# Patient Record
Sex: Female | Born: 2000 | Race: Black or African American | Hispanic: No | Marital: Single | Smoking: Former smoker
Health system: Southern US, Community
[De-identification: ages and names within clinical notes are randomized; demographics above are authoritative.]

## PROBLEM LIST (undated history)

## (undated) DIAGNOSIS — J45909 Unspecified asthma, uncomplicated: Secondary | ICD-10-CM

## (undated) DIAGNOSIS — T7840XA Allergy, unspecified, initial encounter: Secondary | ICD-10-CM

## (undated) DIAGNOSIS — F419 Anxiety disorder, unspecified: Secondary | ICD-10-CM

## (undated) DIAGNOSIS — F32A Depression, unspecified: Secondary | ICD-10-CM

## (undated) HISTORY — DX: Allergy, unspecified, initial encounter: T78.40XA

## (undated) HISTORY — PX: NO PAST SURGERIES: SHX2092

## (undated) HISTORY — DX: Anxiety disorder, unspecified: F41.9

## (undated) HISTORY — DX: Depression, unspecified: F32.A

---
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
## 2021-02-04 ENCOUNTER — Emergency Department (HOSPITAL_COMMUNITY)
Admission: EM | Admit: 2021-02-04 | Discharge: 2021-02-04 | Disposition: A | Discharge status: Home / Self Care | Payer: BLUE CROSS/BLUE SHIELD | Attending: Emergency Medicine | Admitting: Emergency Medicine

## 2021-02-04 DIAGNOSIS — J4520 Mild intermittent asthma, uncomplicated: Secondary | ICD-10-CM | POA: Insufficient documentation

## 2021-02-04 DIAGNOSIS — J4541 Moderate persistent asthma with (acute) exacerbation: Secondary | ICD-10-CM

## 2021-02-04 DIAGNOSIS — Z20822 Contact with and (suspected) exposure to covid-19: Secondary | ICD-10-CM | POA: Diagnosis not present

## 2021-02-04 DIAGNOSIS — R0602 Shortness of breath: Secondary | ICD-10-CM | POA: Diagnosis present

## 2021-02-04 LAB — RESP PANEL BY RT-PCR (FLU A&B, COVID) ARPGX2
Influenza A by PCR: NEGATIVE
Influenza B by PCR: NEGATIVE
SARS Coronavirus 2 by RT PCR: NEGATIVE

## 2021-02-04 MED ORDER — IPRATROPIUM BROMIDE 0.02 % IN SOLN
0.5000 mg | Freq: Once | RESPIRATORY_TRACT | Status: AC
  Administered 2021-02-04: 08:00:00 0.5 mg via RESPIRATORY_TRACT
  Filled 2021-02-04: qty 2.5

## 2021-02-04 MED ORDER — ALBUTEROL SULFATE HFA 108 (90 BASE) MCG/ACT IN AERS
2.0000 | INHALATION_SPRAY | Freq: Once | RESPIRATORY_TRACT | Status: AC
  Administered 2021-02-04: 09:00:00 2 via RESPIRATORY_TRACT
  Filled 2021-02-04: qty 6.7

## 2021-02-04 MED ORDER — ALBUTEROL SULFATE (2.5 MG/3ML) 0.083% IN NEBU
5.0000 mg | INHALATION_SOLUTION | Freq: Once | RESPIRATORY_TRACT | Status: AC
  Administered 2021-02-04: 08:00:00 5 mg via RESPIRATORY_TRACT
  Filled 2021-02-04: qty 6

## 2021-02-04 MED ORDER — PREDNISONE 20 MG PO TABS: 40.0000 mg | ORAL_TABLET | Freq: Every day | ORAL | 0 refills | Status: DC

## 2021-02-04 NOTE — ED Notes (Signed)
Pt provided with meal tray.

## 2021-02-04 NOTE — ED Notes (Signed)
Pt ambulated to the bathroom w/ steady gait w/ out need for assistance.

## 2021-02-04 NOTE — ED Notes (Signed)
Pt states understanding of dc instructions, importance of follow up, and prescription. Pt denies questions or concerns upon dc. Pt declined wheelchair assistance upon dc. Pt ambulated out of ed w/ steady gait. No belongings left in room upon dc.  

## 2021-02-04 NOTE — ED Provider Notes (Signed)
[facility]-EMERGENCY DEPT Provider Note   CSN: [id number] Arrival date & time: 02/04/21  0175     History Chief Complaint  Patient presents with   Shortness of Breath    Abigail Sandoval is a 20 y.o. female.  Patient is a 20 year old female with a history of asthma who is presenting today with complaint of wheezing, chest tightness and shortness of breath that started yesterday.  She reports that she was out of her inhaler but had 1 dose of prednisone left so took that yesterday but continues to have the above symptoms because she has not had her inhaler.  She has had a cough that is been nonproductive and denies any congestion, fever or myalgias.  She has no abdominal pain and last menses was last week.  The history is provided by the patient.  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     Family History  Problem Relation Age of Onset   Asthma Mother    Asthma Sister    Asthma Maternal Grandmother    Allergic rhinitis Neg Hx    Angioedema Neg Hx    Atopy Neg Hx    Eczema Neg Hx    Immunodeficiency Neg Hx    Urticaria Neg Hx     Social History   Tobacco Use   Smoking status: Never   Smokeless tobacco: Never  Vaping Use   Vaping Use: Former  Substance Use Topics   Alcohol use: No   Drug use: Not Currently    Types: Marijuana    Home Medications Prior to Admission medications   Medication Sig Start Date End Date Taking? Authorizing Provider  albuterol (VENTOLIN HFA) 108 (90 Base) MCG/ACT inhaler TAKE 2 PUFFS BY MOUTH EVERY 6 HOURS AS NEEDED FOR WHEEZE OR SHORTNESS OF BREATH 08/08/20    Margaree Mackintosh, MD  azithromycin (ZITHROMAX) 250 MG tablet Take 1 tablet (250 mg total) by mouth daily. Take first 2 tablets together, then 1 every day until finished. 04/07/20   Mickie Bail, NP  ibuprofen (ADVIL) 600 MG tablet Take 1 tablet (600 mg total) by mouth every 6 (six) hours as needed for moderate pain. 12/22/20   Fayrene Helper, PA-C  JUNEL 1.5/30 1.5-30 MG-MCG tablet TAKE 1 TABLET BY MOUTH DAILY. Patient not taking: No sig reported 03/23/18   Margaree Mackintosh, MD  ondansetron (ZOFRAN) 4 MG tablet Take 1 tablet (4 mg total) by mouth every 6 (six) hours. 11/26/20   Virgina Norfolk, DO    Allergies    Patient has no known allergies.  Review of Systems   Review of Systems  Respiratory:  Positive for shortness of breath.   All other systems reviewed and are negative.  Physical Exam Updated Vital Signs BP 103/79    Pulse 88    Temp (!) 97.4 F (36.3 C) (Oral)    Resp 16    Ht 5\' 10"  (1.778 m)    Wt 108.9 kg    SpO2 97%    BMI 34.44 kg/m   Physical Exam Constitutional:  General: She is not in acute distress.    Appearance: She is well-developed.  HENT:     Head: Normocephalic and atraumatic.     Right Ear: External ear normal.     Left Ear: External ear normal.  Eyes:     General:        Right eye: No discharge.     Conjunctiva/sclera: Conjunctivae normal.     Pupils: Pupils are equal, round, and reactive to light.  Cardiovascular:     Rate and Rhythm: Normal rate and regular rhythm.     Heart sounds: Normal heart sounds. No murmur heard. Pulmonary:     Effort: Pulmonary effort is normal. No tachypnea or respiratory distress.     Breath sounds: Wheezing present. No decreased breath sounds, rhonchi or rales.  Abdominal:     Palpations: Abdomen is soft.     Tenderness: There is no abdominal tenderness.  Musculoskeletal:        General: No tenderness. Normal range of motion.     Cervical back: Normal range of motion and neck supple.     Right lower leg: No edema.      Left lower leg: No edema.  Skin:    General: Skin is warm and dry.     Findings: No rash.  Neurological:     Mental Status: She is alert and oriented to person, place, and time. Mental status is at baseline.  Psychiatric:        Mood and Affect: Mood normal.    ED Results / Procedures / Treatments   Labs (all labs ordered are listed, but only abnormal results are displayed) Labs Reviewed  RESP PANEL BY RT-PCR (FLU A&B, COVID) [id number]    EKG None  Radiology No results found.  Procedures Procedures   Medications Ordered in ED Medications  albuterol (PROVENTIL) (2.5 MG/3ML) 0.083% nebulizer solution 5 mg (5 mg Nebulization Given 02/04/21 0814)  ipratropium (ATROVENT) nebulizer solution 0.5 mg (0.5 mg Nebulization Given 02/04/21 3614)    ED Course  I have reviewed the triage vital signs and the nursing notes.  Pertinent labs & imaging results that were available during my care of the patient were reviewed by me and considered in my medical decision making (see chart for details).    MDM Rules/Calculators/A&P                         Pt with typical asthma exacerbation  symptoms.  No infectious sx, productive cough or other complaints.  COVID and flu neg.  Wheezing on exam.  will give albuterol/atrovent and recheck.  8:53 AM Pt improved after treatment.  Given new inhaler here and short course of prednisone.     Final Clinical Impression(s) / ED Diagnoses Final diagnoses:  None    Rx / DC Orders ED Discharge Orders     None        Gwyneth Sprout, MD 02/04/21 [phone]

## 2022-03-03 IMAGING — DX DG CHEST 2V
2 series · 2 of 2 positions shown · non-contrast
Comparison: None.

CLINICAL DATA: Cough and shortness of breath for 3 days.  Asthma.

EXAM:
CHEST - 2 VIEW

[chest pa]
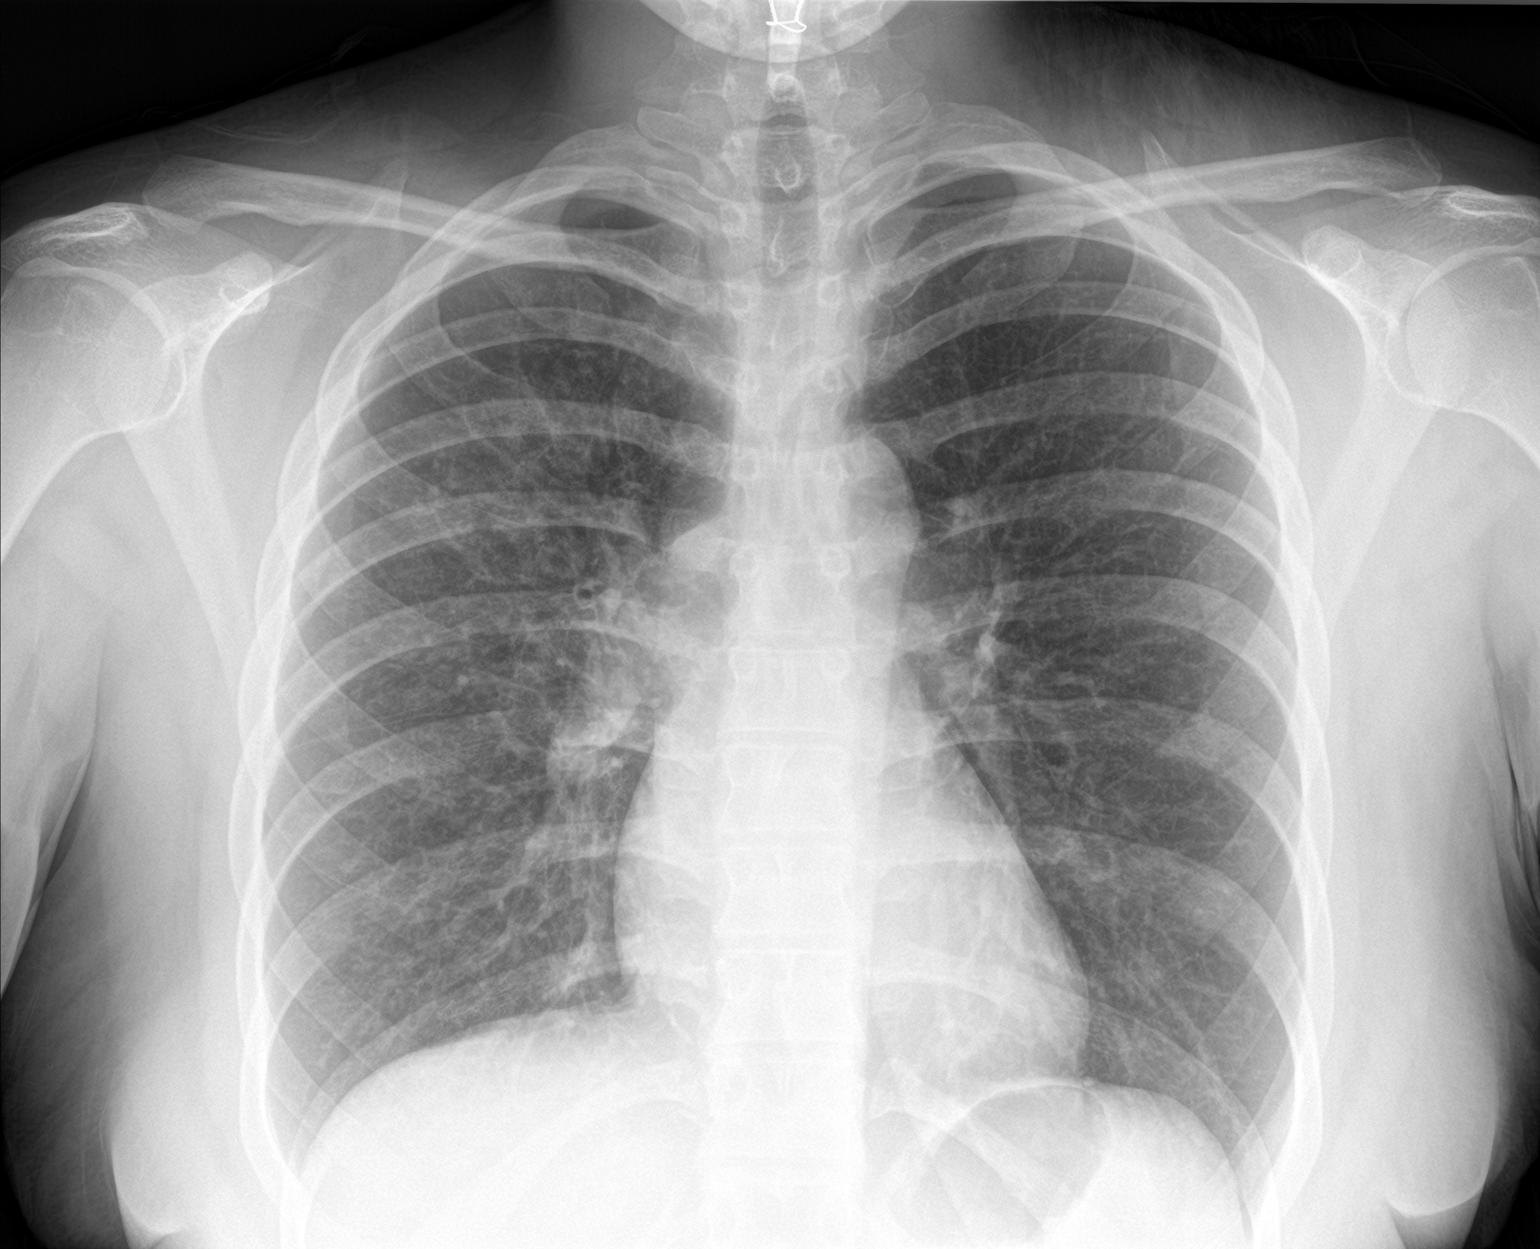

[chest lat]
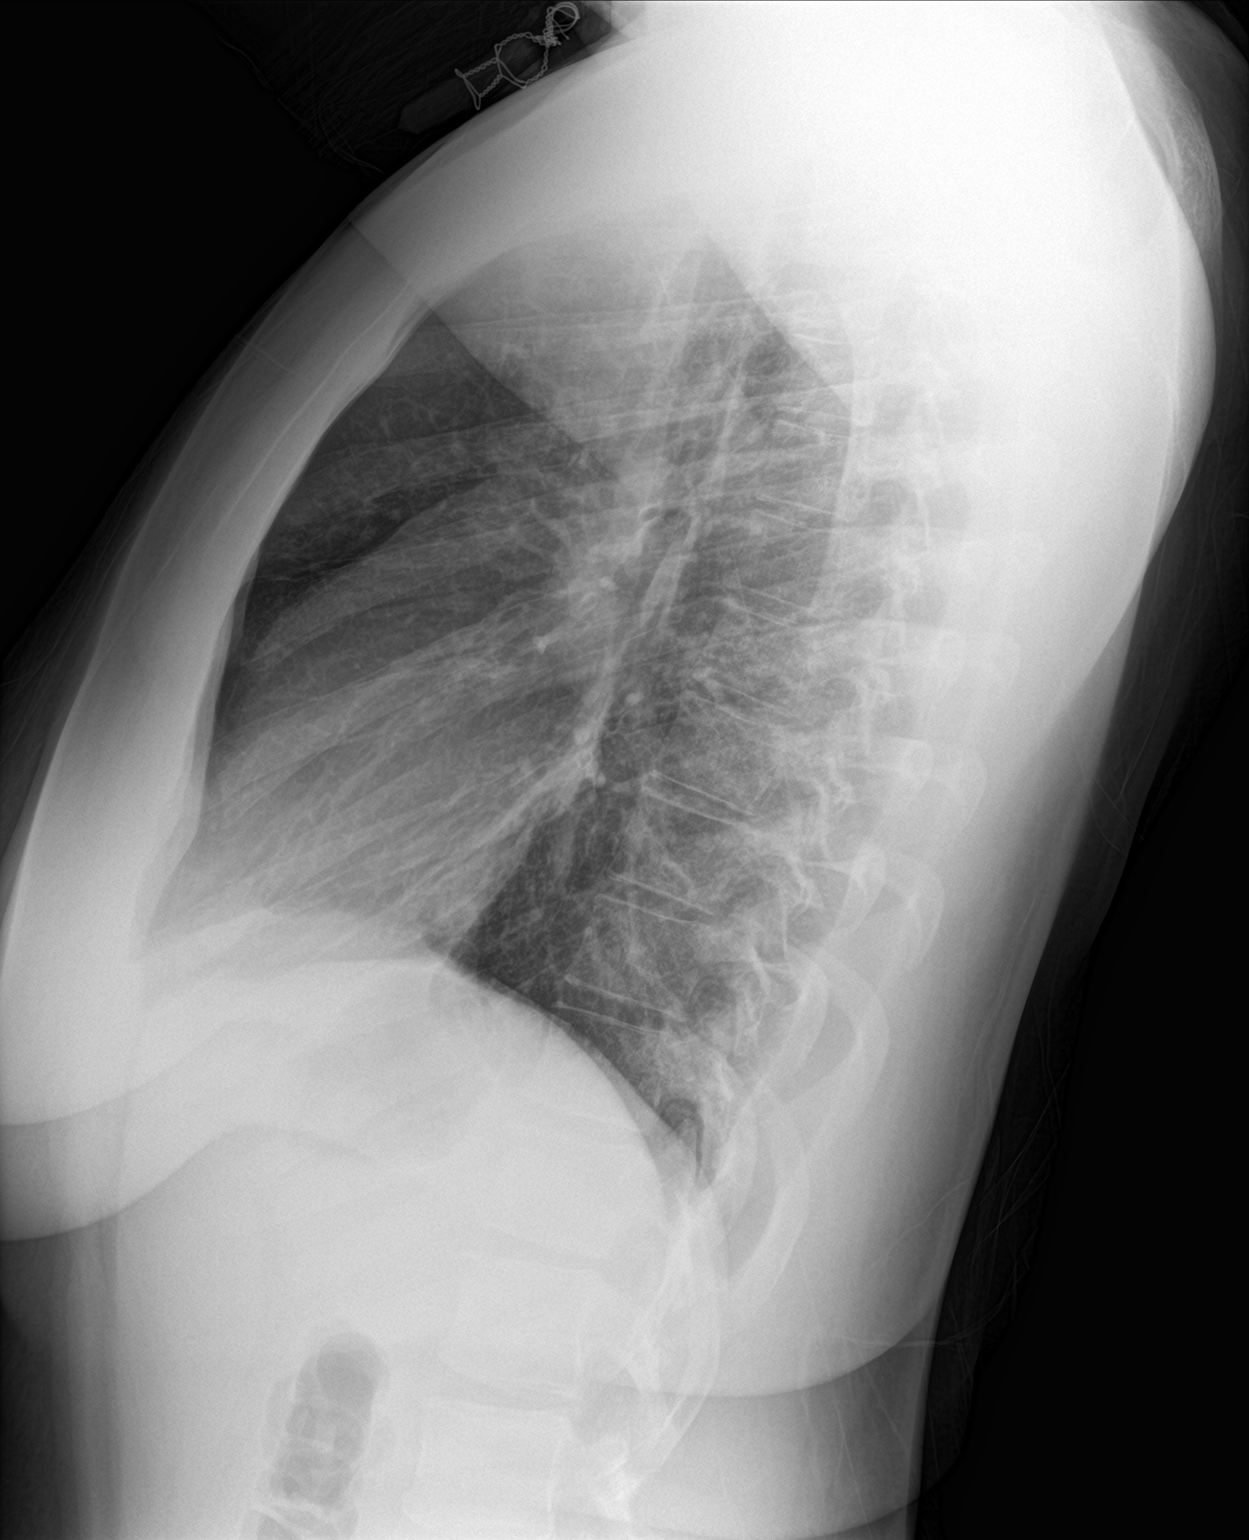

[2 of 2 positions shown; findings below may reference images not displayed]

FINDINGS: The heart size and mediastinal contours are within normal limits.
Subtle asymmetric streaky opacity is seen in the central right upper
lobe, suspicious for bronchopneumonia. No other areas of pulmonary
opacity are seen. No evidence of pleural effusion.
IMPRESSION: Subtle asymmetric streaky opacity in right upper lobe, suspicious
for bronchopneumonia.

## 2022-04-29 IMAGING — CR DG CHEST 2V
2 series · 2 of 2 positions shown · non-contrast
Comparison: Chest radiographs 04/07/2020.

CLINICAL DATA: 19-year-old female with cough and shortness of
breath increasing over the past 3-4 days. History of asthma.

EXAM:
CHEST - 2 VIEW

[w chest pa]
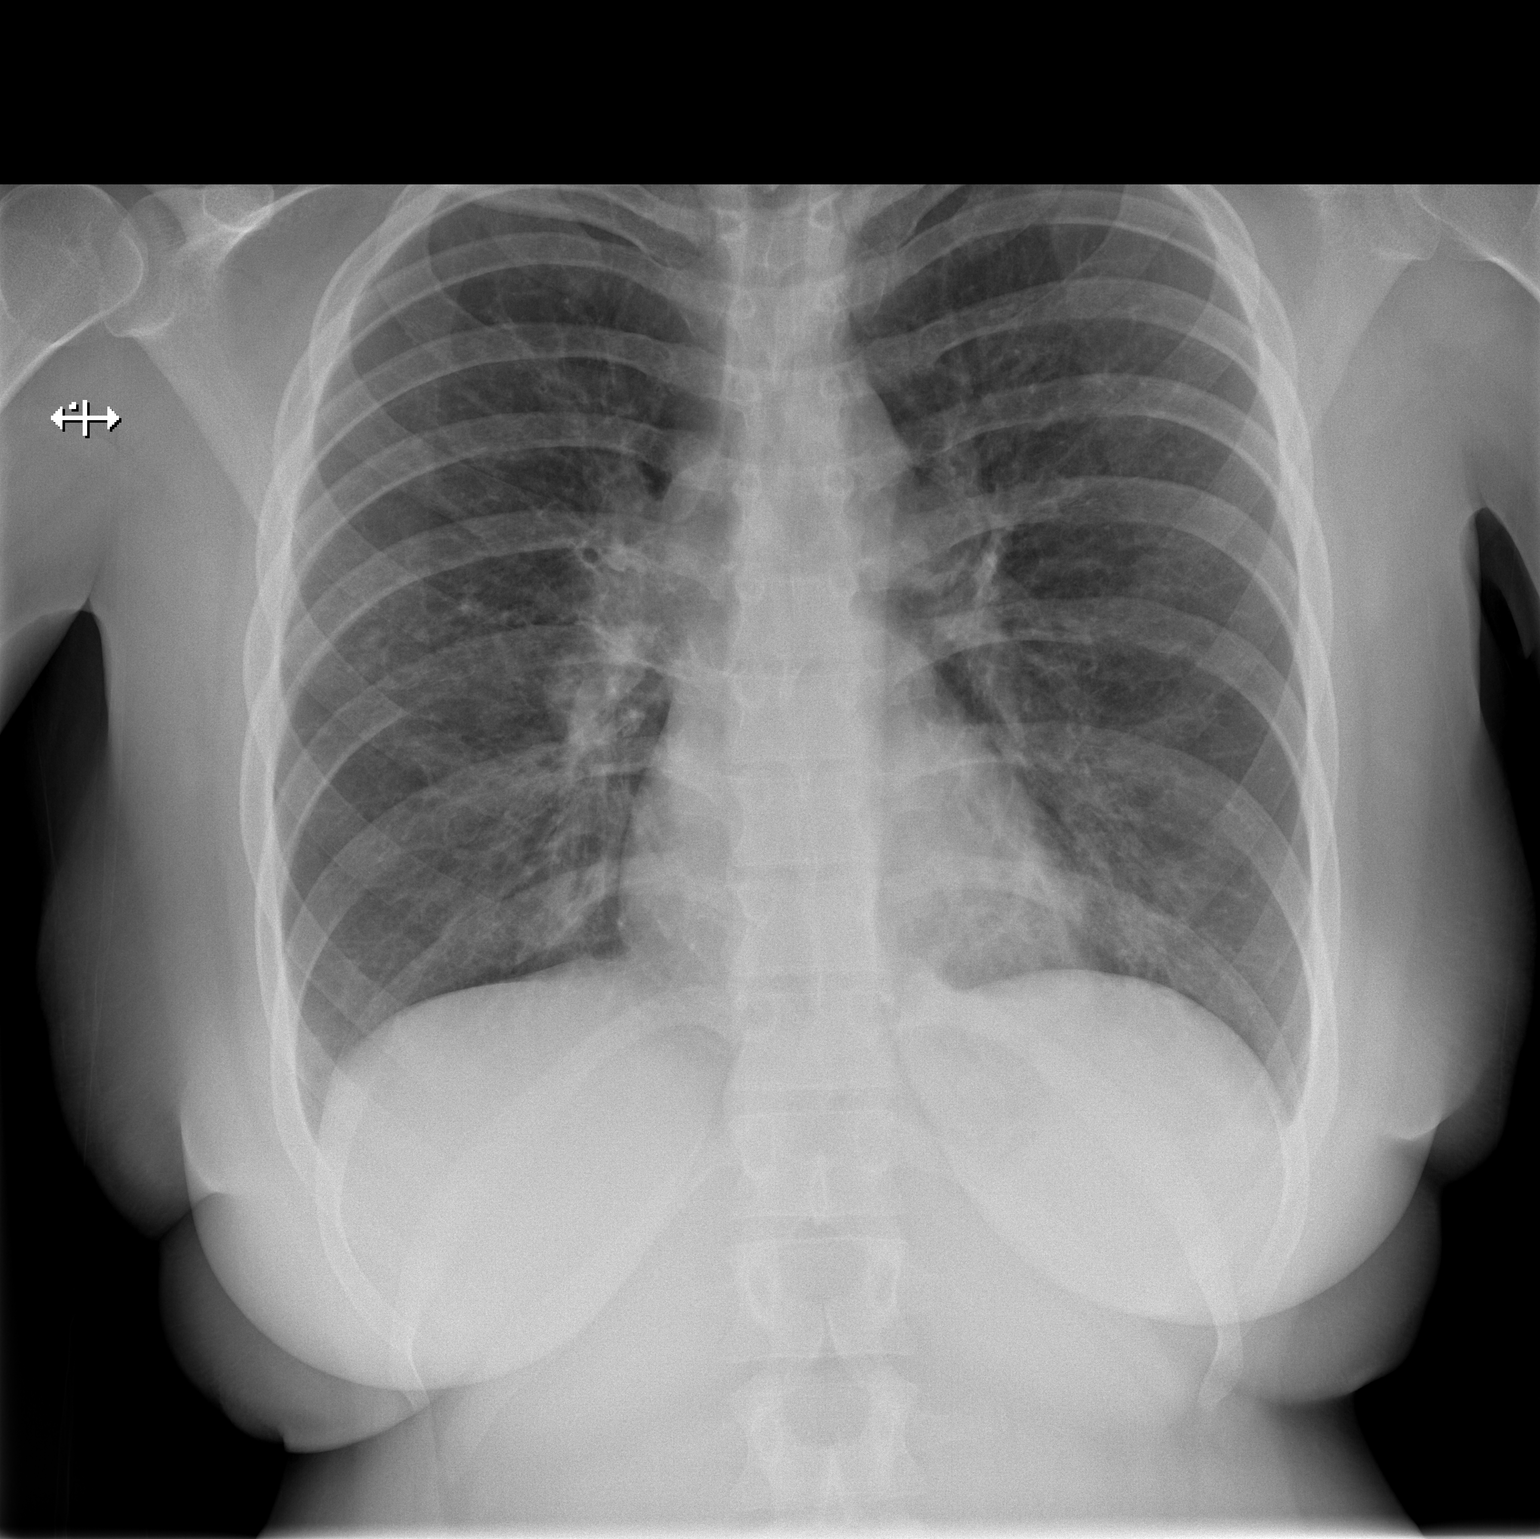

[w chest lat]
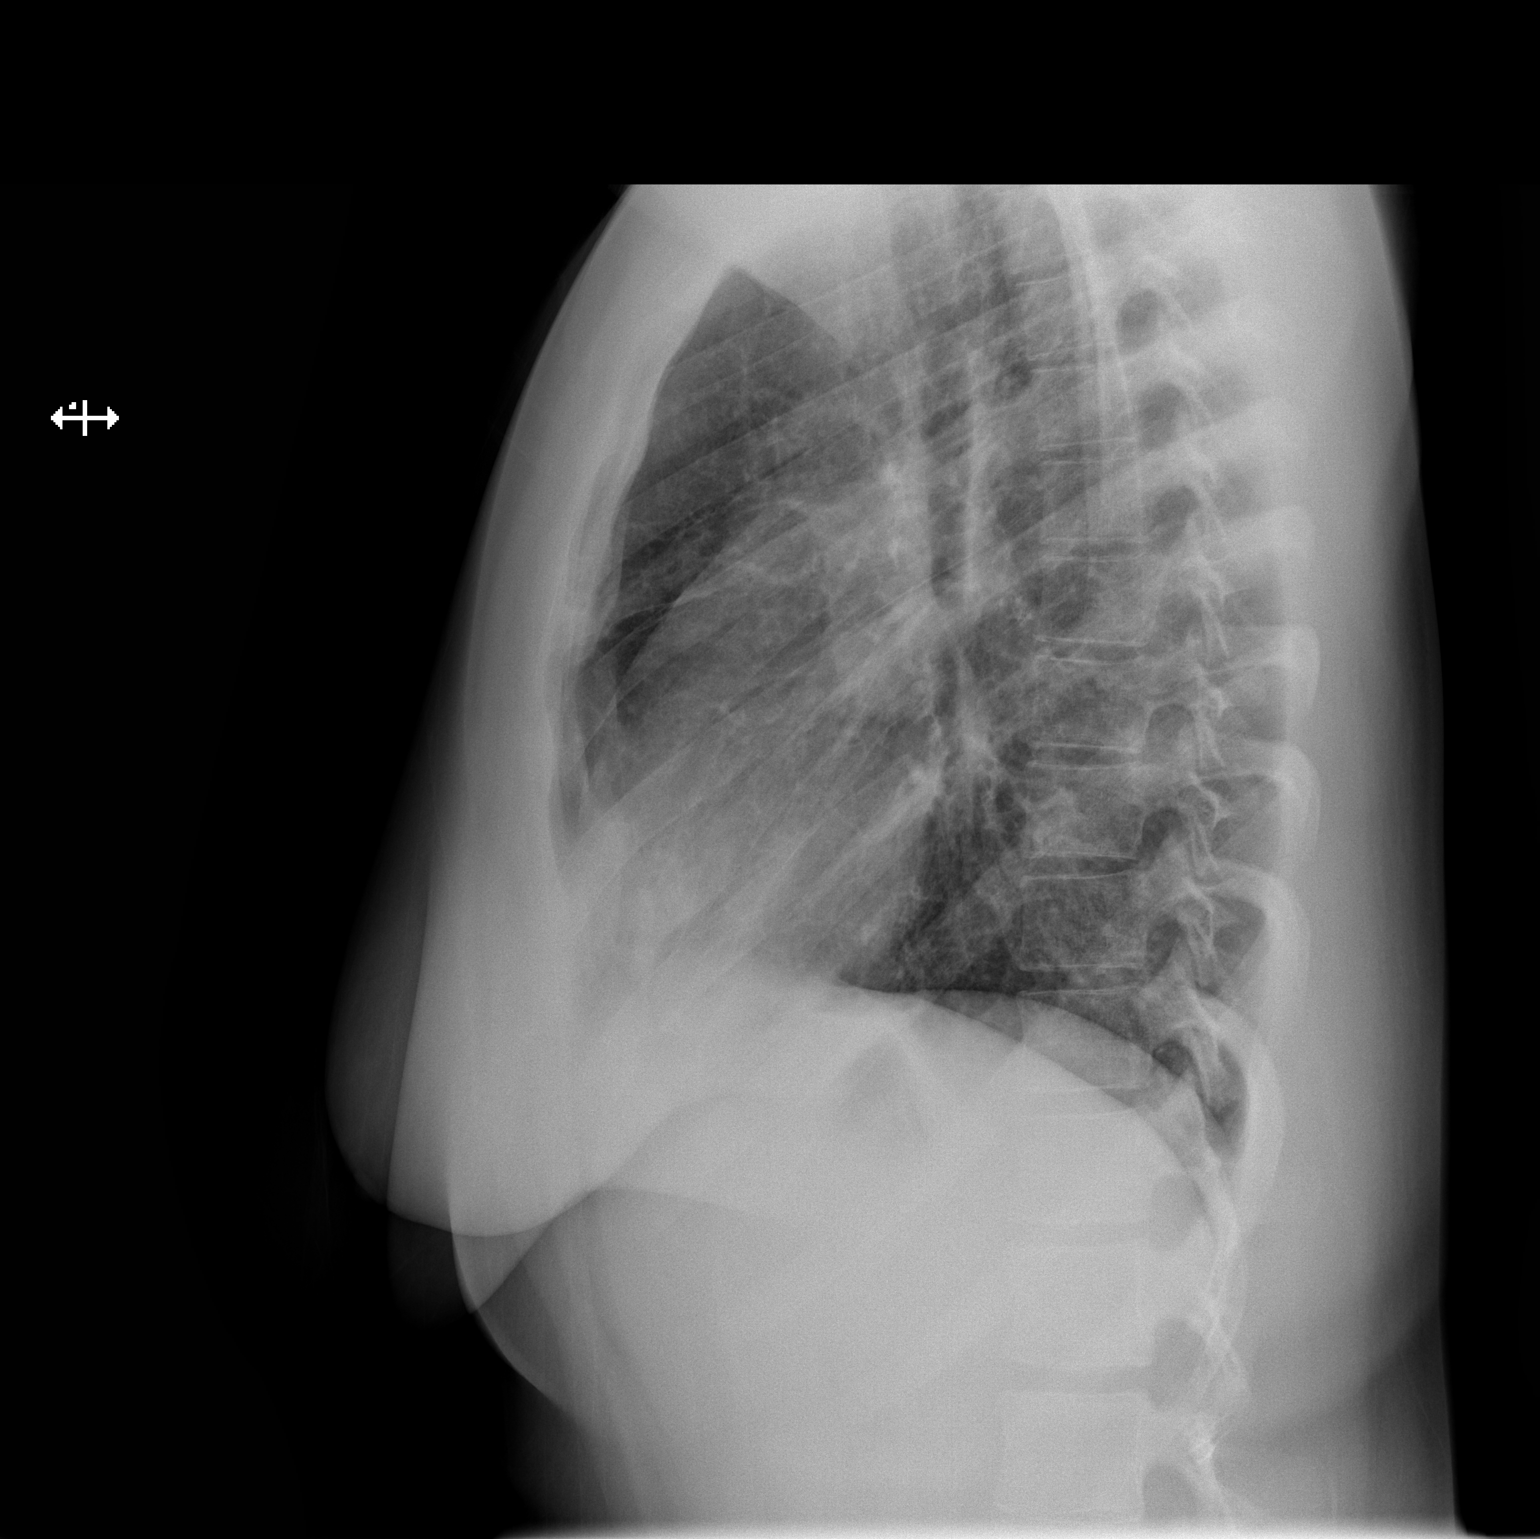

[2 of 2 positions shown; findings below may reference images not displayed]

FINDINGS: Lung volumes and mediastinal contours are within normal limits.
Visualized tracheal air column is within normal limits. Increased
pulmonary interstitial markings in both lungs. Patchy new middle
lobe opacity on the lateral view, probably in the lingula. No
consolidation or pleural effusion. No pneumothorax.

Paucity of bowel gas in the upper abdomen. No osseous abnormality
identified.
IMPRESSION: Patchy middle lobe opacity, suspected to be in the lingula, with
underlying chronic pulmonary interstitial markings. Favor acute
bronchopneumonia superimposed on chronic reactive airway disease. No
pleural effusion.

## 2022-05-18 ENCOUNTER — Other Ambulatory Visit: Payer: Self-pay | Admitting: Internal Medicine

## 2022-05-18 ENCOUNTER — Encounter: Payer: Self-pay | Admitting: Physician Assistant

## 2022-05-18 VITALS — BP 110/70 | HR 87 | Temp 98.2°F | Ht 70.25 in | Wt 283.4 lb

## 2022-05-18 DIAGNOSIS — R569 Unspecified convulsions: Secondary | ICD-10-CM

## 2022-05-18 DIAGNOSIS — E669 Obesity, unspecified: Secondary | ICD-10-CM

## 2022-05-18 DIAGNOSIS — Z0001 Encounter for general adult medical examination with abnormal findings: Secondary | ICD-10-CM | POA: Diagnosis not present

## 2022-05-18 DIAGNOSIS — F419 Anxiety disorder, unspecified: Secondary | ICD-10-CM

## 2022-05-18 DIAGNOSIS — R55 Syncope and collapse: Secondary | ICD-10-CM

## 2022-05-18 DIAGNOSIS — Z789 Other specified health status: Secondary | ICD-10-CM | POA: Diagnosis not present

## 2022-05-18 DIAGNOSIS — N926 Irregular menstruation, unspecified: Secondary | ICD-10-CM

## 2022-05-18 DIAGNOSIS — F319 Bipolar disorder, unspecified: Secondary | ICD-10-CM | POA: Diagnosis not present

## 2022-05-18 DIAGNOSIS — J4540 Moderate persistent asthma, uncomplicated: Secondary | ICD-10-CM

## 2022-05-18 LAB — LIPID PANEL
Cholesterol: 127 mg/dL (ref 0–200)
HDL: 47.6 mg/dL (ref >39.00)
LDL Cholesterol: 64 mg/dL (ref 0–99)
NonHDL: 79.34
Total CHOL/HDL Ratio: 3
Triglycerides: 75 mg/dL (ref 0.0–149.0)
VLDL: 15 mg/dL (ref 0.0–40.0)

## 2022-05-18 LAB — CBC WITH DIFFERENTIAL/PLATELET
Basophils Absolute: 0 10*3/uL (ref 0.0–0.1)
Basophils Relative: 0.7 % (ref 0.0–3.0)
Eosinophils Absolute: 0.2 10*3/uL (ref 0.0–0.7)
Eosinophils Relative: 4 % (ref 0.0–5.0)
HCT: 38.6 % (ref 36.0–46.0)
Hemoglobin: 12.8 g/dL (ref 12.0–15.0)
Lymphocytes Relative: 25.8 % (ref 12.0–46.0)
Lymphs Abs: 1.3 10*3/uL (ref 0.7–4.0)
MCHC: 33.1 g/dL (ref 30.0–36.0)
MCV: 84.8 fl (ref 78.0–100.0)
Monocytes Absolute: 0.5 10*3/uL (ref 0.1–1.0)
Monocytes Relative: 11.3 % (ref 3.0–12.0)
Neutro Abs: 2.8 10*3/uL (ref 1.4–7.7)
Neutrophils Relative %: 58.2 % (ref 43.0–77.0)
Platelets: 319 10*3/uL (ref 150.0–400.0)
RBC: 4.55 Mil/uL (ref 3.87–5.11)
RDW: 14.6 % (ref 11.5–14.6)
WBC: 4.9 10*3/uL (ref 4.5–10.5)

## 2022-05-18 LAB — COMPREHENSIVE METABOLIC PANEL
ALT: 18 U/L (ref 0–35)
AST: 14 U/L (ref 0–37)
Albumin: 4.1 g/dL (ref 3.5–5.2)
Alkaline Phosphatase: 55 U/L (ref 39–117)
BUN: 15 mg/dL (ref 6–23)
CO2: 24 mEq/L (ref 19–32)
Calcium: 9.3 mg/dL (ref 8.4–10.5)
Chloride: 107 mEq/L (ref 96–112)
Creatinine, Ser: 1 mg/dL (ref 0.40–1.20)
GFR: 81.09 mL/min (ref >60.00)
Glucose, Bld: 68 mg/dL — ABNORMAL LOW (ref 70–99)
Potassium: 3.9 mEq/L (ref 3.5–5.1)
Sodium: 138 mEq/L (ref 135–145)
Total Bilirubin: 0.3 mg/dL (ref 0.2–1.2)
Total Protein: 6.8 g/dL (ref 6.0–8.3)

## 2022-05-18 LAB — HEMOGLOBIN A1C: Hgb A1c MFr Bld: 5.3 % (ref 4.6–6.5)

## 2022-05-18 LAB — TSH: TSH: 1.1 u[IU]/mL (ref 0.35–5.50)

## 2022-05-18 LAB — POCT URINE PREGNANCY: Preg Test, Ur: NEGATIVE

## 2022-05-18 MED ORDER — BECLOMETHASONE DIPROP HFA 40 MCG/ACT IN AERB: 1.0000 | INHALATION_SPRAY | Freq: Two times a day (BID) | RESPIRATORY_TRACT | 1 refills | Status: DC

## 2022-05-18 NOTE — Patient Instructions (Addendum)
It was great to see you!  Referral to gynecology, cardiology, neurology, and talk therapy Start steroid inhaler twice daily to reduce frequency of albuterol use  Please contact the prescriber of your wellbutrin to let them know that you are being evaluated for possible seizure  If any new or worsening symptoms in the meantime, please go to the ER.  Please go to the lab for blood work.   Our office will call you with your results unless you have chosen to receive results via MyChart.  If your blood work is normal we will follow-up each year for physicals and as scheduled for chronic medical problems.  If anything is abnormal we will treat accordingly and get you in for a follow-up.  Take care,  Lelon Mast

## 2022-05-18 NOTE — Progress Notes (Signed)
Subjective:    Abigail Sandoval is a 22 y.o. female and is here for a comprehensive physical exam.  HPI  Health Maintenance Due  Topic Date Due   CHLAMYDIA SCREENING  02/19/2019    Acute Concerns: Syncope Occurred 3 weeks ago Had been awake for 24 hours prior to episode and the amount of sleep prior to that was 4-5 hours Was experiencing feelings of dissociation prior to and after episode Her boyfriend witnessed event said she passed out for a few seconds She had "light shaking throughout her entire body" while down No tongue biting or incontinence Did not hit her head Had limited food day of as well - denies prior low blood sugar episodes Denies prior episodes  Irregular periods Has had changes to her periods and would like to see gynecology Was on a OCP at one point   Chronic Issues: Asthma Triggered by season changes, pollen Uses albuterol twice daily Hx of ICS use but ran out  Bipolar depression; GAD Managed by provider at [facility] Currently taking Wellbutrin 150 mg daily (but is prescribed twice daily) and 12.5 mg zoloft BID Started Wellbutrin in Sep last year Denies current SI/HI  Health Maintenance: Immunizations -- UTD Colonoscopy -- n/a Mammogram -- n/a PAP -- n/a Bone Density -- n/a Diet -- eating healthy diet and drinks water Exercise -- getting regular exercise; walking every day  Sleep habits -- often doesn't have enough time to sleep Mood -- overall stable  UTD with dentist? - yes UTD with eye doctor? - yes  Weight history: Wt Readings from Last 10 Encounters:  05/18/22 283 lb 6.1 oz (128.5 kg)  02/04/21 240 lb (108.9 kg) (>99 %, Z= 2.40)*  12/26/20 230 lb (104.3 kg) (99 %, Z= 2.31)*  12/22/20 222 lb (100.7 kg) (99 %, Z= 2.23)*  11/26/20 215 lb (97.5 kg) (98 %, Z= 2.15)*  06/03/20 197 lb (89.4 kg) (97 %, Z= 1.92)*  07/27/19 239 lb (108.4 kg) (>99 %, Z= 2.37)*  03/14/18 247 lb (112 kg) (>99 %, Z= 2.49)*  02/28/18 242 lb  (109.8 kg) (>99 %, Z= 2.46)*  02/18/18 238 lb (108 kg) (>99 %, Z= 2.43)*   * Growth percentiles are based on CDC (Girls, 2-20 Years) data.   Body mass index is 40.37 kg/m. Patient's last menstrual period was 04/20/2022 (exact date).  Alcohol use:  reports current alcohol use.  Tobacco use:  Tobacco Use: High Risk (05/18/2022)   Patient History    Smoking Tobacco Use: Every Day    Smokeless Tobacco Use: Never    Passive Exposure: Not on file   Eligible for lung cancer screening? no     05/18/2022   10:40 AM  Depression screen PHQ 2/9  Decreased Interest 1  Down, Depressed, Hopeless 0  PHQ - 2 Score 1  Altered sleeping 1  Tired, decreased energy 1  Change in appetite 0  Feeling bad or failure about yourself  1  Trouble concentrating 2  Moving slowly or fidgety/restless 0  Suicidal thoughts 0  PHQ-9 Score 6  Difficult doing work/chores Somewhat difficult     Other providers/specialists: Patient Care Team: Jarold Motto, [place] as PCP - General (Physician Assistant)    PMHx, SurgHx, SocialHx, Medications, and Allergies were reviewed in the Visit Navigator and updated as appropriate.   Past Medical History:  Diagnosis Date   Allergy    Anxiety    Asthma    Depression      Past Surgical History:  Procedure Laterality Date   NO PAST SURGERIES       Family History  Problem Relation Age of Onset   Asthma Mother    Obesity Mother    Asthma Sister    Asthma Maternal Grandmother    Diabetes Maternal Grandfather    Allergic rhinitis Neg Hx    Angioedema Neg Hx    Atopy Neg Hx    Eczema Neg Hx    Immunodeficiency Neg Hx    Urticaria Neg Hx     Social History   Tobacco Use   Smoking status: Every Day    Types: E-cigarettes   Smokeless tobacco: Never  Vaping Use   Vaping Use: Every day  Substance Use Topics   Alcohol use: Yes    Comment: socially twice a month   Drug use: Yes    Types: Marijuana    Review of Systems:   Review of Systems   Constitutional:  Negative for chills, fever, malaise/fatigue and weight loss.  HENT:  Negative for hearing loss, sinus pain and sore throat.   Respiratory:  Negative for cough and hemoptysis.   Cardiovascular:  Negative for chest pain, palpitations, leg swelling and PND.  Gastrointestinal:  Negative for abdominal pain, constipation, diarrhea, heartburn, nausea and vomiting.  Genitourinary:  Negative for dysuria, frequency and urgency.  Musculoskeletal:  Negative for back pain, myalgias and neck pain.  Skin:  Negative for itching and rash.  Neurological:  Negative for dizziness, tingling, seizures and headaches.  Endo/Heme/Allergies:  Negative for polydipsia.  Psychiatric/Behavioral:  Negative for depression. The patient is not nervous/anxious.     Objective:   BP 110/70 (BP Location: Left Arm, Patient Position: Sitting, Cuff Size: Large)   Pulse 87   Temp 98.2 F (36.8 C) (Temporal)   Ht 5' 10.25" (1.784 m)   Wt 283 lb 6.1 oz (128.5 kg)   LMP 04/20/2022 (Exact Date)   SpO2 98%   BMI 40.37 kg/m  Body mass index is 40.37 kg/m.   General Appearance:    Alert, cooperative, no distress, appears stated age  Head:    Normocephalic, without obvious abnormality, atraumatic  Eyes:    PERRL, conjunctiva/corneas clear, EOM's intact, fundi    benign, both eyes  Ears:    Normal TM's and external ear canals, both ears  Nose:   Nares normal, septum midline, mucosa normal, no drainage    or sinus tenderness  Throat:   Lips, mucosa, and tongue normal; teeth and gums normal  Neck:   Supple, symmetrical, trachea midline, no adenopathy;    thyroid:  no enlargement/tenderness/nodules; no carotid   bruit or JVD  Back:     Symmetric, no curvature, ROM normal, no CVA tenderness  Lungs:     Clear to auscultation bilaterally, respirations unlabored  Chest Wall:    No tenderness or deformity   Heart:    Regular rate and rhythm, S1 and S2 normal, no murmur, rub or gallop  Breast Exam:    Deferred   Abdomen:     Soft, non-tender, bowel sounds active all four quadrants,    no masses, no organomegaly  Genitalia:    Deferred  Extremities:   Extremities normal, atraumatic, no cyanosis or edema  Pulses:   2+ and symmetric all extremities  Skin:   Skin color, texture, turgor normal, no rashes or lesions  Lymph nodes:   Cervical, supraclavicular, and axillary nodes normal  Neurologic:   CNII-XII intact, normal strength, sensation and reflexes    throughout  Assessment/Plan:   Encounter for general adult medical examination with abnormal findings Today patient counseled on age appropriate routine health concerns for screening and prevention, each reviewed and up to date or declined. Immunizations reviewed and up to date or declined. Labs ordered and reviewed. Risk factors for depression reviewed and negative. Hearing function and visual acuity are intact. ADLs screened and addressed as needed. Functional ability and level of safety reviewed and appropriate. Education, counseling and referrals performed based on assessed risks today. Patient provided with a copy of personalized plan for preventive services.  Bipolar depression; Anxiety No red flags on discussion Recommend close follow-up with prescriber and to let them know that she has had recent "seizure-like activity" since starting wellbutrin Denies SI/HI Referral to talk therapy  Electronic cigarette use Encouraged reduction  Obesity, unspecified classification, unspecified obesity type, unspecified whether serious comorbidity present Continue healthy lifestyle efforts  Syncope, unspecified syncope type; Witnessed seizure-like activity Unclear etiology -- possibly due to exhaustion and/or low blood sugar however she is also concerned about seizures Referral to cardiology and neurology Discouraged her from further driving until work-up has been performed, if able  Irregular periods Referral to gyn  Persistent  asthma Uncontrolled Recommend ICS (QVAR) BID - this has been sent Continue albuterol prn Follow-up if ongoing lack of improvement  Jarold Motto, PA-C [facility]

## 2022-08-03 NOTE — Progress Notes (Shared)
Abigail Sandoval is a 22 y.o. female here for a new problem.  History of Present Illness:   No chief complaint on file.   HPI  Weight Loss  PCOS    Past Medical History:  Diagnosis Date   Allergy    Anxiety    Asthma    Depression      Social History   Tobacco Use   Smoking status: Every Day    Types: E-cigarettes   Smokeless tobacco: Never  Vaping Use   Vaping Use: Every day  Substance Use Topics   Alcohol use: Yes    Comment: socially twice a month   Drug use: Yes    Types: Marijuana    Past Surgical History:  Procedure Laterality Date   NO PAST SURGERIES      Family History  Problem Relation Age of Onset   Asthma Mother    Obesity Mother    Thyroid disease Mother    Asthma Sister    Asthma Maternal Grandmother    Breast cancer Maternal Grandmother    Diabetes Maternal Grandfather    Parkinson's disease Paternal Grandfather    Breast cancer Paternal Great-grandmother    Allergic rhinitis Neg Hx    Angioedema Neg Hx    Atopy Neg Hx    Eczema Neg Hx    Immunodeficiency Neg Hx    Urticaria Neg Hx     No Known Allergies  Current Medications:   Current Outpatient Medications:    albuterol (VENTOLIN HFA) 108 (90 Base) MCG/ACT inhaler, TAKE 2 PUFFS BY MOUTH EVERY 6 HOURS AS NEEDED FOR WHEEZE OR SHORTNESS OF BREATH, Disp: 8.5 each, Rfl: 0   beclomethasone (QVAR) 40 MCG/ACT inhaler, Inhale 1 puff into the lungs 2 (two) times daily., Disp: 1 each, Rfl: 1   sertraline (ZOLOFT) 25 MG tablet, Take 12.5 mg by mouth 2 (two) times daily., Disp: , Rfl:    WELLBUTRIN XL 150 MG 24 hr tablet, Take 150 mg by mouth 2 (two) times daily., Disp: , Rfl:    Review of Systems:   ROS  Vitals:   There were no vitals filed for this visit.   There is no height or weight on file to calculate BMI.  Physical Exam:   Physical Exam  Assessment and Plan:   ***   I,Alexander Ruley,acting as a scribe for Jarold Motto, PA.,have documented all relevant  documentation on the behalf of Jarold Motto, PA,as directed by  Jarold Motto, PA while in the presence of Jarold Motto, [place].   ***   Jarold Motto, PA-C

## 2022-08-05 ENCOUNTER — Ambulatory Visit: Payer: BLUE CROSS/BLUE SHIELD | Admitting: Physician Assistant

## 2022-08-11 NOTE — Progress Notes (Shared)
Abigail Sandoval is a 22 y.o. female here for a new problem.  History of Present Illness:   No chief complaint on file.   HPI  Weight Loss    Past Medical History:  Diagnosis Date   Allergy    Anxiety    Asthma    Depression      Social History   Tobacco Use   Smoking status: Every Day    Types: E-cigarettes   Smokeless tobacco: Never  Vaping Use   Vaping Use: Every day  Substance Use Topics   Alcohol use: Yes    Comment: socially twice a month   Drug use: Yes    Types: Marijuana    Past Surgical History:  Procedure Laterality Date   NO PAST SURGERIES      Family History  Problem Relation Age of Onset   Asthma Mother    Obesity Mother    Thyroid disease Mother    Asthma Sister    Asthma Maternal Grandmother    Breast cancer Maternal Grandmother    Diabetes Maternal Grandfather    Parkinson's disease Paternal Grandfather    Breast cancer Paternal Great-grandmother    Allergic rhinitis Neg Hx    Angioedema Neg Hx    Atopy Neg Hx    Eczema Neg Hx    Immunodeficiency Neg Hx    Urticaria Neg Hx     No Known Allergies  Current Medications:   Current Outpatient Medications:    albuterol (VENTOLIN HFA) 108 (90 Base) MCG/ACT inhaler, TAKE 2 PUFFS BY MOUTH EVERY 6 HOURS AS NEEDED FOR WHEEZE OR SHORTNESS OF BREATH, Disp: 8.5 each, Rfl: 0   beclomethasone (QVAR) 40 MCG/ACT inhaler, Inhale 1 puff into the lungs 2 (two) times daily., Disp: 1 each, Rfl: 1   sertraline (ZOLOFT) 25 MG tablet, Take 12.5 mg by mouth 2 (two) times daily., Disp: , Rfl:    WELLBUTRIN XL 150 MG 24 hr tablet, Take 150 mg by mouth 2 (two) times daily., Disp: , Rfl:    Review of Systems:   ROS  Vitals:   There were no vitals filed for this visit.   There is no height or weight on file to calculate BMI.  Physical Exam:   Physical Exam  Assessment and Plan:   ***   I,Alexander Ruley,acting as a scribe for Jarold Motto, PA.,have documented all relevant documentation on  the behalf of Jarold Motto, PA,as directed by  Jarold Motto, PA while in the presence of Jarold Motto, [place].   ***   Jarold Motto, PA-C

## 2022-08-12 ENCOUNTER — Ambulatory Visit: Payer: BLUE CROSS/BLUE SHIELD | Admitting: Physician Assistant

## 2022-08-13 ENCOUNTER — Other Ambulatory Visit: Payer: Self-pay | Admitting: Physician Assistant

## 2022-08-17 ENCOUNTER — Encounter: Payer: Self-pay | Admitting: Physician Assistant

## 2022-08-17 ENCOUNTER — Ambulatory Visit: Payer: BLUE CROSS/BLUE SHIELD | Admitting: Physician Assistant

## 2022-08-17 VITALS — BP 110/80 | HR 92 | Temp 98.2°F | Ht 70.25 in | Wt 283.0 lb

## 2022-08-17 DIAGNOSIS — E669 Obesity, unspecified: Secondary | ICD-10-CM

## 2022-08-17 DIAGNOSIS — R41840 Attention and concentration deficit: Secondary | ICD-10-CM

## 2022-08-17 DIAGNOSIS — F319 Bipolar disorder, unspecified: Secondary | ICD-10-CM

## 2022-08-17 MED ORDER — SEMAGLUTIDE-WEIGHT MANAGEMENT 0.25 MG/0.5ML ~~LOC~~ SOAJ: 0.2500 mg | SUBCUTANEOUS | 1 refills | Status: DC

## 2022-08-17 NOTE — Patient Instructions (Signed)
It was great to see you!  I will place referral for a new psychiatrist and for ADHD assessment  I'm going to send in Wegovy 0.25 mg weekly Hopefully we will know in a week or two if this is covered by your insurance  Take care,  Jarold Motto PA-C

## 2022-08-17 NOTE — Progress Notes (Signed)
Abigail Sandoval is a 22 y.o. female here for a new problem.  History of Present Illness:   Chief Complaint  Patient presents with   Obesity    Pt wants to discuss weight loss medication.    HPI  Obesity She is requesting either weight loss medication or weight loss surgery.  She has tried losing weight and is currently walking at least 10,000 steps daily and strength training at the gym.  She also improved her diet and cut out bread and is on a pescetarian type diet.  She denies family medical history of thyroid cancer.  Wt Readings from Last 3 Encounters:  08/17/22 283 lb (128.4 kg)  05/18/22 283 lb 6.1 oz (128.5 kg)  02/04/21 240 lb (108.9 kg) (>99 %, Z= 2.40)*   * Growth percentiles are based on CDC (Girls, 2-20 Years) data.   Bipolar Depression She stopped taking 25 mg Zoloft daily PO due to her feeling flat while taking it.  She also stopped 150 mg Wellbutrin XL 2x daily PO due developing digestion issues while taking it. She has occasional episode of depression and lack of motivation.  She is interested in a new psychiatrist Denies SI/HI   ADHD testing: She is seeing a psychiatrist for ADHD testing and is on a wait list that currently has a year wait time.  She is requesting to find another facility that can test her faster.    Past Medical History:  Diagnosis Date   Allergy    Anxiety    Asthma    Depression      Social History   Tobacco Use   Smoking status: Former    Types: E-cigarettes    Quit date: 08/09/2022    Years since quitting: 0.0   Smokeless tobacco: Never  Vaping Use   Vaping Use: Every day  Substance Use Topics   Alcohol use: Yes    Comment: socially twice a month   Drug use: Yes    Types: Marijuana    Past Surgical History:  Procedure Laterality Date   NO PAST SURGERIES      Family History  Problem Relation Age of Onset   Asthma Mother    Obesity Mother    Thyroid disease Mother    Asthma Sister    Asthma Maternal  Grandmother    Breast cancer Maternal Grandmother    Diabetes Maternal Grandfather    Parkinson's disease Paternal Grandfather    Breast cancer Paternal Great-grandmother    Allergic rhinitis Neg Hx    Angioedema Neg Hx    Atopy Neg Hx    Eczema Neg Hx    Immunodeficiency Neg Hx    Urticaria Neg Hx     No Known Allergies  Current Medications:   Current Outpatient Medications:    albuterol (VENTOLIN HFA) 108 (90 Base) MCG/ACT inhaler, TAKE 2 PUFFS BY MOUTH EVERY 6 HOURS AS NEEDED FOR WHEEZE OR SHORTNESS OF BREATH, Disp: 8.5 each, Rfl: 0   beclomethasone (QVAR) 40 MCG/ACT inhaler, Inhale 1 puff into the lungs 2 (two) times daily., Disp: 1 each, Rfl: 1   Semaglutide-Weight Management 0.25 MG/0.5ML SOAJ, Inject 0.25 mg into the skin once a week., Disp: 2 mL, Rfl: 1   Review of Systems:   ROS Negative unless otherwise specified per HPI.  Vitals:   Vitals:   08/17/22 1345  BP: 110/80  Pulse: 92  Temp: 98.2 F (36.8 C)  TempSrc: Temporal  SpO2: 96%  Weight: 283 lb (128.4 kg)  Height: 5'  10.25" (1.784 m)     Body mass index is 40.32 kg/m.  Physical Exam:   Physical Exam Vitals and nursing note reviewed.  Constitutional:      General: She is not in acute distress.    Appearance: She is well-developed. She is not ill-appearing or toxic-appearing.  Cardiovascular:     Rate and Rhythm: Normal rate and regular rhythm.     Pulses: Normal pulses.     Heart sounds: Normal heart sounds, S1 normal and S2 normal.  Pulmonary:     Effort: Pulmonary effort is normal.     Breath sounds: Normal breath sounds.  Skin:    General: Skin is warm and dry.  Neurological:     Mental Status: She is alert.     GCS: GCS eye subscore is 4. GCS verbal subscore is 5. GCS motor subscore is 6.  Psychiatric:        Speech: Speech normal.        Behavior: Behavior normal. Behavior is cooperative.     Assessment and Plan:   Obesity, unspecified classification, unspecified obesity type,  unspecified whether serious comorbidity present Medication options reviewed Continue efforts at healthy lifestyle Reviewed risks, benefits, side effect(s) of Wegovy -- she is agreeable to trial Start 0.25 mg weekly Wegovy Follow-up in 1-3 months, sooner if concerns Discussed that this medication is not safe for pregnancy  Bipolar depression (HCC) Denies any suicidal ideation/homicidal ideation Referral to new psychiatry officer per patient request I discussed with patient that if they develop any SI, to tell someone immediately and seek medical attention.  Attention or concentration deficit Referral for ADHD testing at [facility]   [employer] as a scribe for Jarold Motto, PA.,have documented all relevant documentation on the behalf of Jarold Motto, PA,as directed by  Jarold Motto, PA while in the presence of Jarold Motto, [place].  I, Jarold Motto, [place], have reviewed all documentation for this visit. The documentation on 08/17/22 for the exam, diagnosis, procedures, and orders are all accurate and complete.  Jarold Motto, PA-C

## 2022-08-21 ENCOUNTER — Emergency Department (HOSPITAL_COMMUNITY)
Admission: EM | Admit: 2022-08-21 | Discharge: 2022-08-22 | Disposition: A | Discharge status: Home / Self Care | Payer: BLUE CROSS/BLUE SHIELD | Attending: Emergency Medicine | Admitting: Emergency Medicine

## 2022-08-21 DIAGNOSIS — J45901 Unspecified asthma with (acute) exacerbation: Secondary | ICD-10-CM | POA: Diagnosis not present

## 2022-08-21 DIAGNOSIS — R0602 Shortness of breath: Secondary | ICD-10-CM | POA: Diagnosis present

## 2022-08-21 NOTE — ED Triage Notes (Signed)
Pt. Arrives POV stating that she feels like she is having an asthma attack. She said that she has been around her friend who is sick and she thinks that it flared up her asthma.

## 2022-08-22 ENCOUNTER — Other Ambulatory Visit: Payer: Self-pay

## 2022-08-22 ENCOUNTER — Emergency Department (HOSPITAL_COMMUNITY): Payer: BLUE CROSS/BLUE SHIELD

## 2022-08-22 ENCOUNTER — Encounter (HOSPITAL_COMMUNITY): Payer: Self-pay

## 2022-08-22 LAB — BASIC METABOLIC PANEL
Anion gap: 8 (ref 5–15)
BUN: 12 mg/dL (ref 6–20)
CO2: 21 mmol/L — ABNORMAL LOW (ref 22–32)
Calcium: 8.5 mg/dL — ABNORMAL LOW (ref 8.9–10.3)
Chloride: 106 mmol/L (ref 98–111)
Creatinine, Ser: 0.84 mg/dL (ref 0.44–1.00)
GFR, Estimated: >60 mL/min (ref >60)
Glucose, Bld: 92 mg/dL (ref 70–99)
Potassium: 3.5 mmol/L (ref 3.5–5.1)
Sodium: 135 mmol/L (ref 135–145)

## 2022-08-22 LAB — CBC WITH DIFFERENTIAL/PLATELET
Abs Immature Granulocytes: 0.01 10*3/uL (ref 0.00–0.07)
Basophils Absolute: 0 10*3/uL (ref 0.0–0.1)
Basophils Relative: 1 %
Eosinophils Absolute: 0.2 10*3/uL (ref 0.0–0.5)
Eosinophils Relative: 4 %
HCT: 35.5 % — ABNORMAL LOW (ref 36.0–46.0)
Hemoglobin: 11.7 g/dL — ABNORMAL LOW (ref 12.0–15.0)
Immature Granulocytes: 0 %
Lymphocytes Relative: 29 %
Lymphs Abs: 1.3 10*3/uL (ref 0.7–4.0)
MCH: 28.1 pg (ref 26.0–34.0)
MCHC: 33 g/dL (ref 30.0–36.0)
MCV: 85.3 fL (ref 80.0–100.0)
Monocytes Absolute: 0.4 10*3/uL (ref 0.1–1.0)
Monocytes Relative: 10 %
Neutro Abs: 2.6 10*3/uL (ref 1.7–7.7)
Neutrophils Relative %: 56 %
Platelets: 288 10*3/uL (ref 150–400)
RBC: 4.16 MIL/uL (ref 3.87–5.11)
RDW: 14.6 % (ref 11.5–15.5)
WBC: 4.5 10*3/uL (ref 4.0–10.5)
nRBC: 0 % (ref 0.0–0.2)

## 2022-08-22 MED ORDER — PREDNISONE 20 MG PO TABS
60.0000 mg | ORAL_TABLET | Freq: Once | ORAL | Status: AC
  Administered 2022-08-22: 60 mg via ORAL
  Filled 2022-08-22: qty 3

## 2022-08-22 MED ORDER — ALBUTEROL SULFATE HFA 108 (90 BASE) MCG/ACT IN AERS
2.0000 | INHALATION_SPRAY | RESPIRATORY_TRACT | Status: DC | PRN
  Administered 2022-08-22: 2 via RESPIRATORY_TRACT
  Filled 2022-08-22 (×2): qty 6.7

## 2022-08-22 MED ORDER — IPRATROPIUM-ALBUTEROL 0.5-2.5 (3) MG/3ML IN SOLN
3.0000 mL | Freq: Once | RESPIRATORY_TRACT | Status: AC
  Administered 2022-08-22: 3 mL via RESPIRATORY_TRACT
  Filled 2022-08-22: qty 3

## 2022-08-22 MED ORDER — ALBUTEROL SULFATE HFA 108 (90 BASE) MCG/ACT IN AERS: 1.0000 | INHALATION_SPRAY | RESPIRATORY_TRACT | 0 refills | Status: DC | PRN

## 2022-08-22 NOTE — Discharge Instructions (Signed)
You were evaluated today for wheezing.  Your symptoms are consistent with an asthma attack.  I have refilled your albuterol inhaler.  Please follow-up with your primary care team for further evaluation and management of your underlying asthma.  If you develop any life-threatening symptoms please return to the emergency department.

## 2022-08-22 NOTE — ED Provider Notes (Signed)
[place] EMERGENCY DEPARTMENT AT [facility] Provider Note   CSN: [id number] Arrival date & time: 08/21/22  2353     History  Chief Complaint  Patient presents with   Shortness of Breath    Abigail Sandoval is a 22 y.o. female.  Patient presents to the emergency department complaining of increased wheezing.  She states she has been around sick contacts and feels that she may have "caught a virus".  She states that for 2 days she has had difficulty breathing.  She had no albuterol at home.   She denies chest pain, abdominal pain, nausea, vomiting, fevers, rhinorrhea, headache. She has a history of mild intermittent asthma, allergies, bipolar disorder  HPI     Home Medications Prior to Admission medications   Medication Sig Start Date End Date Taking? Authorizing Provider  albuterol (VENTOLIN HFA) 108 (90 Base) MCG/ACT inhaler Inhale 1-2 puffs into the lungs every 4 (four) hours as needed for wheezing or shortness of breath. 08/22/22   Darrick Grinder, PA-C  beclomethasone (QVAR) 40 MCG/ACT inhaler Inhale 1 puff into the lungs 2 (two) times daily. 05/18/22   Jarold Motto, PA  Semaglutide-Weight Management 0.25 MG/0.5ML SOAJ Inject 0.25 mg into the skin once a week. 08/17/22   Jarold Motto, PA      Allergies    Patient has no known allergies.    Review of Systems   Review of Systems  Physical Exam Updated Vital Signs BP (!) 135/90   Pulse 95   Temp 99 F (37.2 C) (Oral)   Resp 17   LMP 07/27/2022 (Exact Date)   SpO2 100%  Physical Exam Vitals and nursing note reviewed.  Constitutional:      General: She is not in acute distress.    Appearance: She is well-developed.  HENT:     Head: Normocephalic and atraumatic.  Eyes:     Conjunctiva/sclera: Conjunctivae normal.  Cardiovascular:     Rate and Rhythm: Normal rate and regular rhythm.     Heart sounds: No murmur heard. Pulmonary:     Effort: Pulmonary effort is normal. No respiratory distress.      Breath sounds: Examination of the right-upper field reveals wheezing. Examination of the left-upper field reveals wheezing. Examination of the right-lower field reveals wheezing. Examination of the left-lower field reveals wheezing. Wheezing present.  Abdominal:     Palpations: Abdomen is soft.     Tenderness: There is no abdominal tenderness.  Musculoskeletal:        General: No swelling.     Cervical back: Neck supple.  Skin:    General: Skin is warm and dry.     Capillary Refill: Capillary refill takes less than 2 seconds.  Neurological:     Mental Status: She is alert.  Psychiatric:        Mood and Affect: Mood normal.     ED Results / Procedures / Treatments   Labs (all labs ordered are listed, but only abnormal results are displayed) Labs Reviewed  BASIC METABOLIC PANEL - Abnormal; Notable for the following components:      Result Value   CO2 21 (*)    Calcium 8.5 (*)    All other components within normal limits  CBC WITH DIFFERENTIAL/PLATELET - Abnormal; Notable for the following components:   Hemoglobin 11.7 (*)    HCT 35.5 (*)    All other components within normal limits    EKG None  Radiology DG Chest 2 View  Result Date: 08/22/2022  CLINICAL DATA:  Dyspnea. EXAM: CHEST - 2 VIEW COMPARISON:  November 26, 2020 FINDINGS: The heart size and mediastinal contours are within normal limits. Both lungs are clear. The visualized skeletal structures are unremarkable. IMPRESSION: No active cardiopulmonary disease. Electronically Signed   By: Aram Candela M.D.   On: 08/22/2022 01:23    Procedures Procedures    Medications Ordered in ED Medications  albuterol (VENTOLIN HFA) 108 (90 Base) MCG/ACT inhaler 2 puff (2 puffs Inhalation Given 08/22/22 0030)  ipratropium-albuterol (DUONEB) 0.5-2.5 (3) MG/3ML nebulizer solution 3 mL (3 mLs Nebulization Given 08/22/22 0030)  ipratropium-albuterol (DUONEB) 0.5-2.5 (3) MG/3ML nebulizer solution 3 mL (3 mLs Nebulization Given  08/22/22 0151)  predniSONE (DELTASONE) tablet 60 mg (60 mg Oral Given 08/22/22 0151)    ED Course/ Medical Decision Making/ A&P                             Medical Decision Making Amount and/or Complexity of Data Reviewed Labs: ordered. Radiology: ordered.  Risk Prescription drug management.   This patient presents to the ED for concern of shortness of breath, this involves an extensive number of treatment options, and is a complaint that carries with it a high risk of complications and morbidity.  The differential diagnosis includes asthma exacerbation, viral illness, others   Co morbidities that complicate the patient evaluation  History of asthma   Lab Tests:  I Ordered, and personally interpreted labs.  The pertinent results include: Grossly unremarkable CBC, BMP   Imaging Studies ordered:  I ordered imaging studies including chest x-ray I independently visualized and interpreted imaging which showed no acute findings I agree with the radiologist interpretation   Problem List / ED Course / Critical interventions / Medication management   I ordered medication including albuterol, DuoNeb for wheezes, prednisone Reevaluation of the patient after these medicines showed that the patient improved I have reviewed the patients home medicines and have made adjustments as needed   Test / Admission - Considered:  Patient's wheezes improved after medication administration.  Plan to discharge home at this time with prescription for albuterol inhaler and recommendations for follow-up with her primary care team for reevaluation of her underlying asthma.  Patient voices understanding with plan.         Final Clinical Impression(s) / ED Diagnoses Final diagnoses:  Exacerbation of asthma, unspecified asthma severity, unspecified whether persistent    Rx / DC Orders ED Discharge Orders          Ordered    albuterol (VENTOLIN HFA) 108 (90 Base) MCG/ACT inhaler  Every 4  hours PRN        08/22/22 0246              Darrick Grinder, PA-C 08/22/22 0248    Nira Conn, MD 08/22/22 [phone]

## 2022-08-25 NOTE — Progress Notes (Shared)
Abigail Sandoval is a 22 y.o. female here for a new problem.  History of Present Illness:   No chief complaint on file.   HPI  Asthma Seen in ED 08/21/22 for exacerbation of asthma. Treated with albuterol nebulizer, prednisone. Prescribed albuterol inhaler on discharge. Chest X-ray showed No active cardiopulmonary disease.   Past Medical History:  Diagnosis Date   Allergy    Anxiety    Asthma    Depression      Social History   Tobacco Use   Smoking status: Former    Types: E-cigarettes    Quit date: 08/09/2022    Years since quitting: 0.0   Smokeless tobacco: Never  Vaping Use   Vaping status: Every Day  Substance Use Topics   Alcohol use: Yes    Comment: socially twice a month   Drug use: Yes    Types: Marijuana    Past Surgical History:  Procedure Laterality Date   NO PAST SURGERIES      Family History  Problem Relation Age of Onset   Asthma Mother    Obesity Mother    Thyroid disease Mother    Asthma Sister    Asthma Maternal Grandmother    Breast cancer Maternal Grandmother    Diabetes Maternal Grandfather    Parkinson's disease Paternal Grandfather    Breast cancer Paternal Great-grandmother    Allergic rhinitis Neg Hx    Angioedema Neg Hx    Atopy Neg Hx    Eczema Neg Hx    Immunodeficiency Neg Hx    Urticaria Neg Hx     No Known Allergies  Current Medications:   Current Outpatient Medications:    albuterol (VENTOLIN HFA) 108 (90 Base) MCG/ACT inhaler, Inhale 1-2 puffs into the lungs every 4 (four) hours as needed for wheezing or shortness of breath., Disp: 8.5 each, Rfl: 0   beclomethasone (QVAR) 40 MCG/ACT inhaler, Inhale 1 puff into the lungs 2 (two) times daily., Disp: 1 each, Rfl: 1   Semaglutide-Weight Management 0.25 MG/0.5ML SOAJ, Inject 0.25 mg into the skin once a week., Disp: 2 mL, Rfl: 1   Review of Systems:   ROS  Vitals:   There were no vitals filed for this visit.   There is no height or weight on file to calculate  BMI.  Physical Exam:   Physical Exam  Assessment and Plan:   ***   I,Alexander Ruley,acting as a scribe for Jarold Motto, PA.,have documented all relevant documentation on the behalf of Jarold Motto, PA,as directed by  Jarold Motto, PA while in the presence of Jarold Motto, [place].   ***   Jarold Motto, PA-C

## 2022-08-26 ENCOUNTER — Ambulatory Visit: Payer: BLUE CROSS/BLUE SHIELD | Admitting: Physician Assistant

## 2022-10-22 IMAGING — DX DG CHEST 2V
2 series · 2 of 2 positions shown · non-contrast
Comparison: Prior chest radiographs 06/03/2020 and earlier.

CLINICAL DATA: Provided history: Wheezing. Additional history
provided: Patient seen and treated for bronchitis last week,
nausea/vomiting and headache today, history of pneumonia earlier
this year.

EXAM:
CHEST - 2 VIEW

[chest pa]
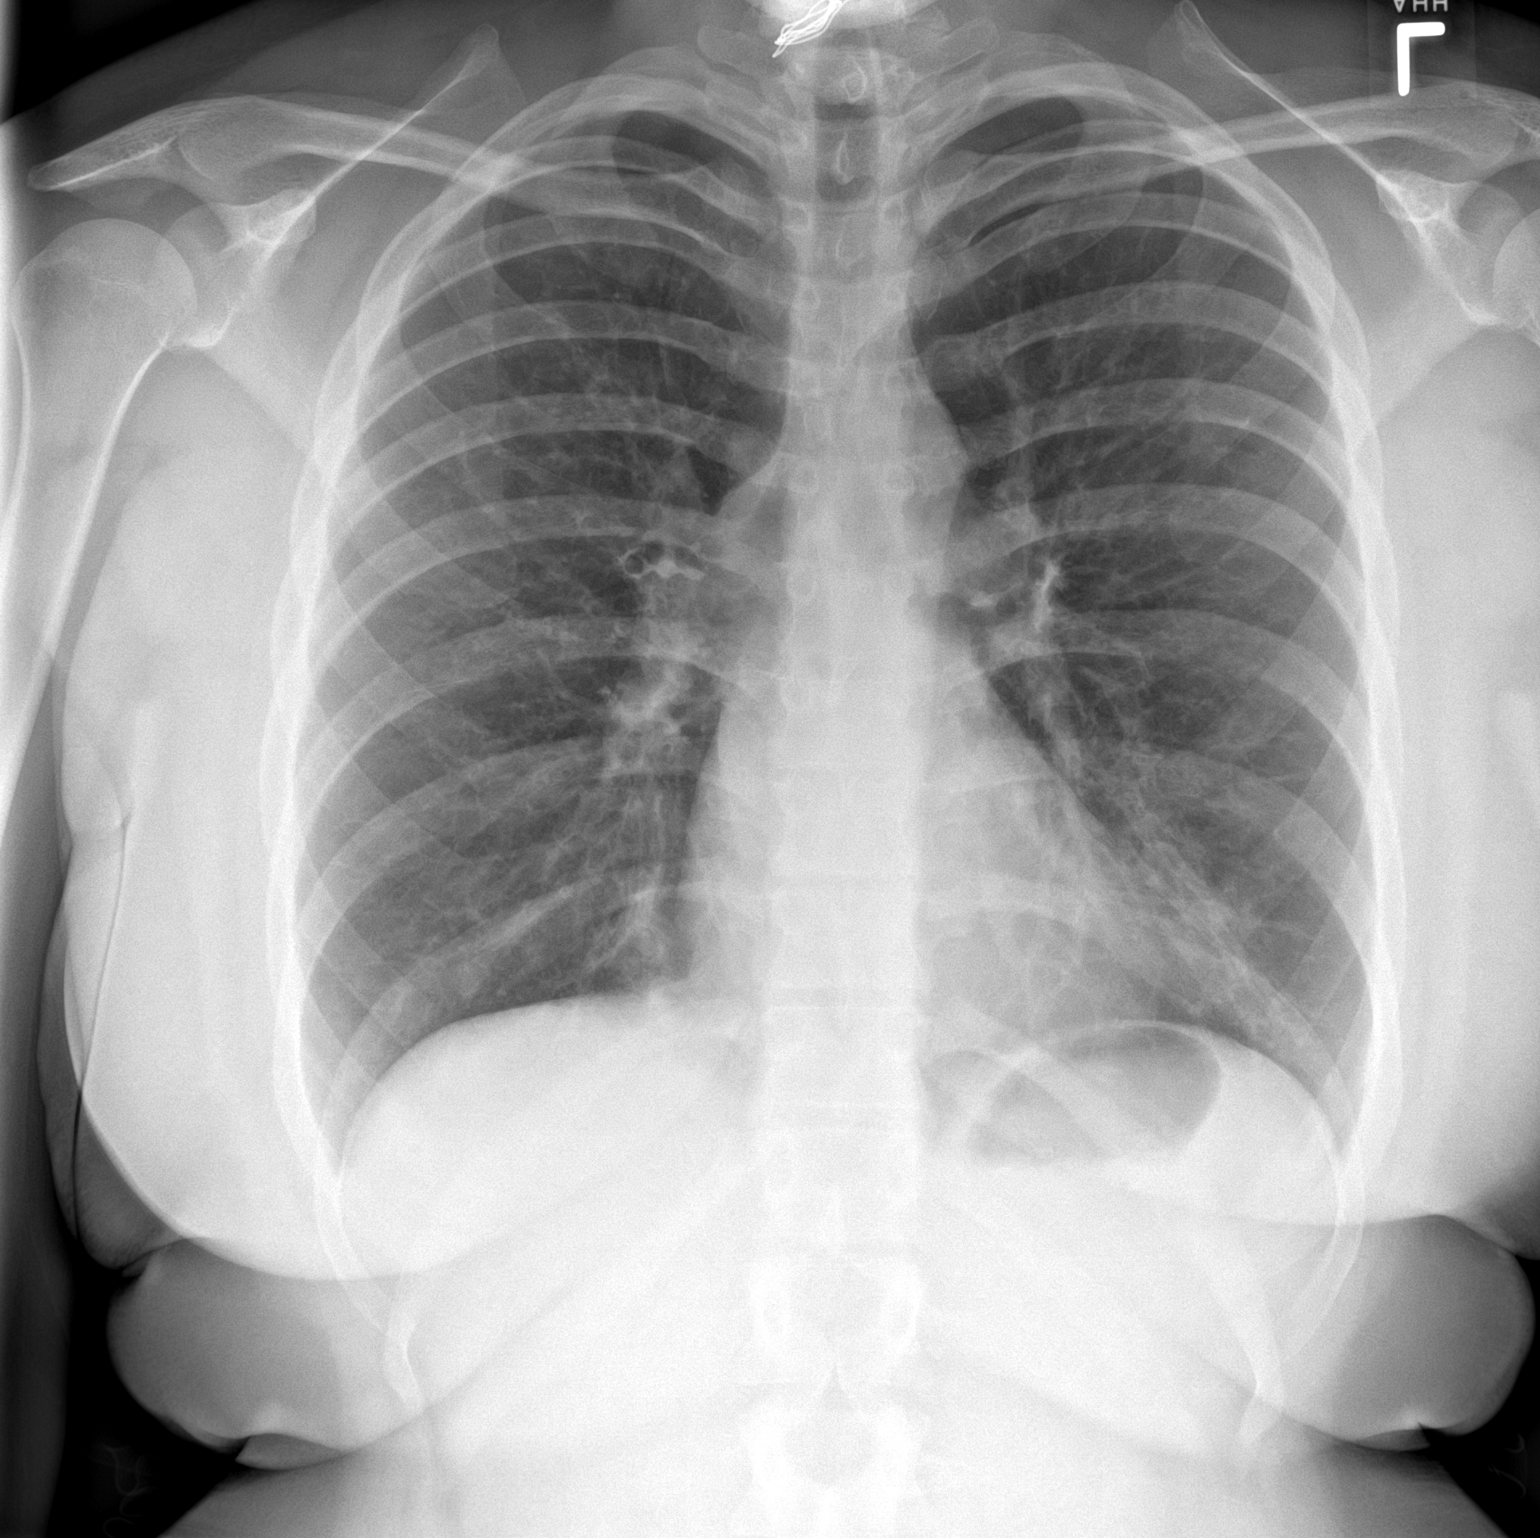

[chest lat]
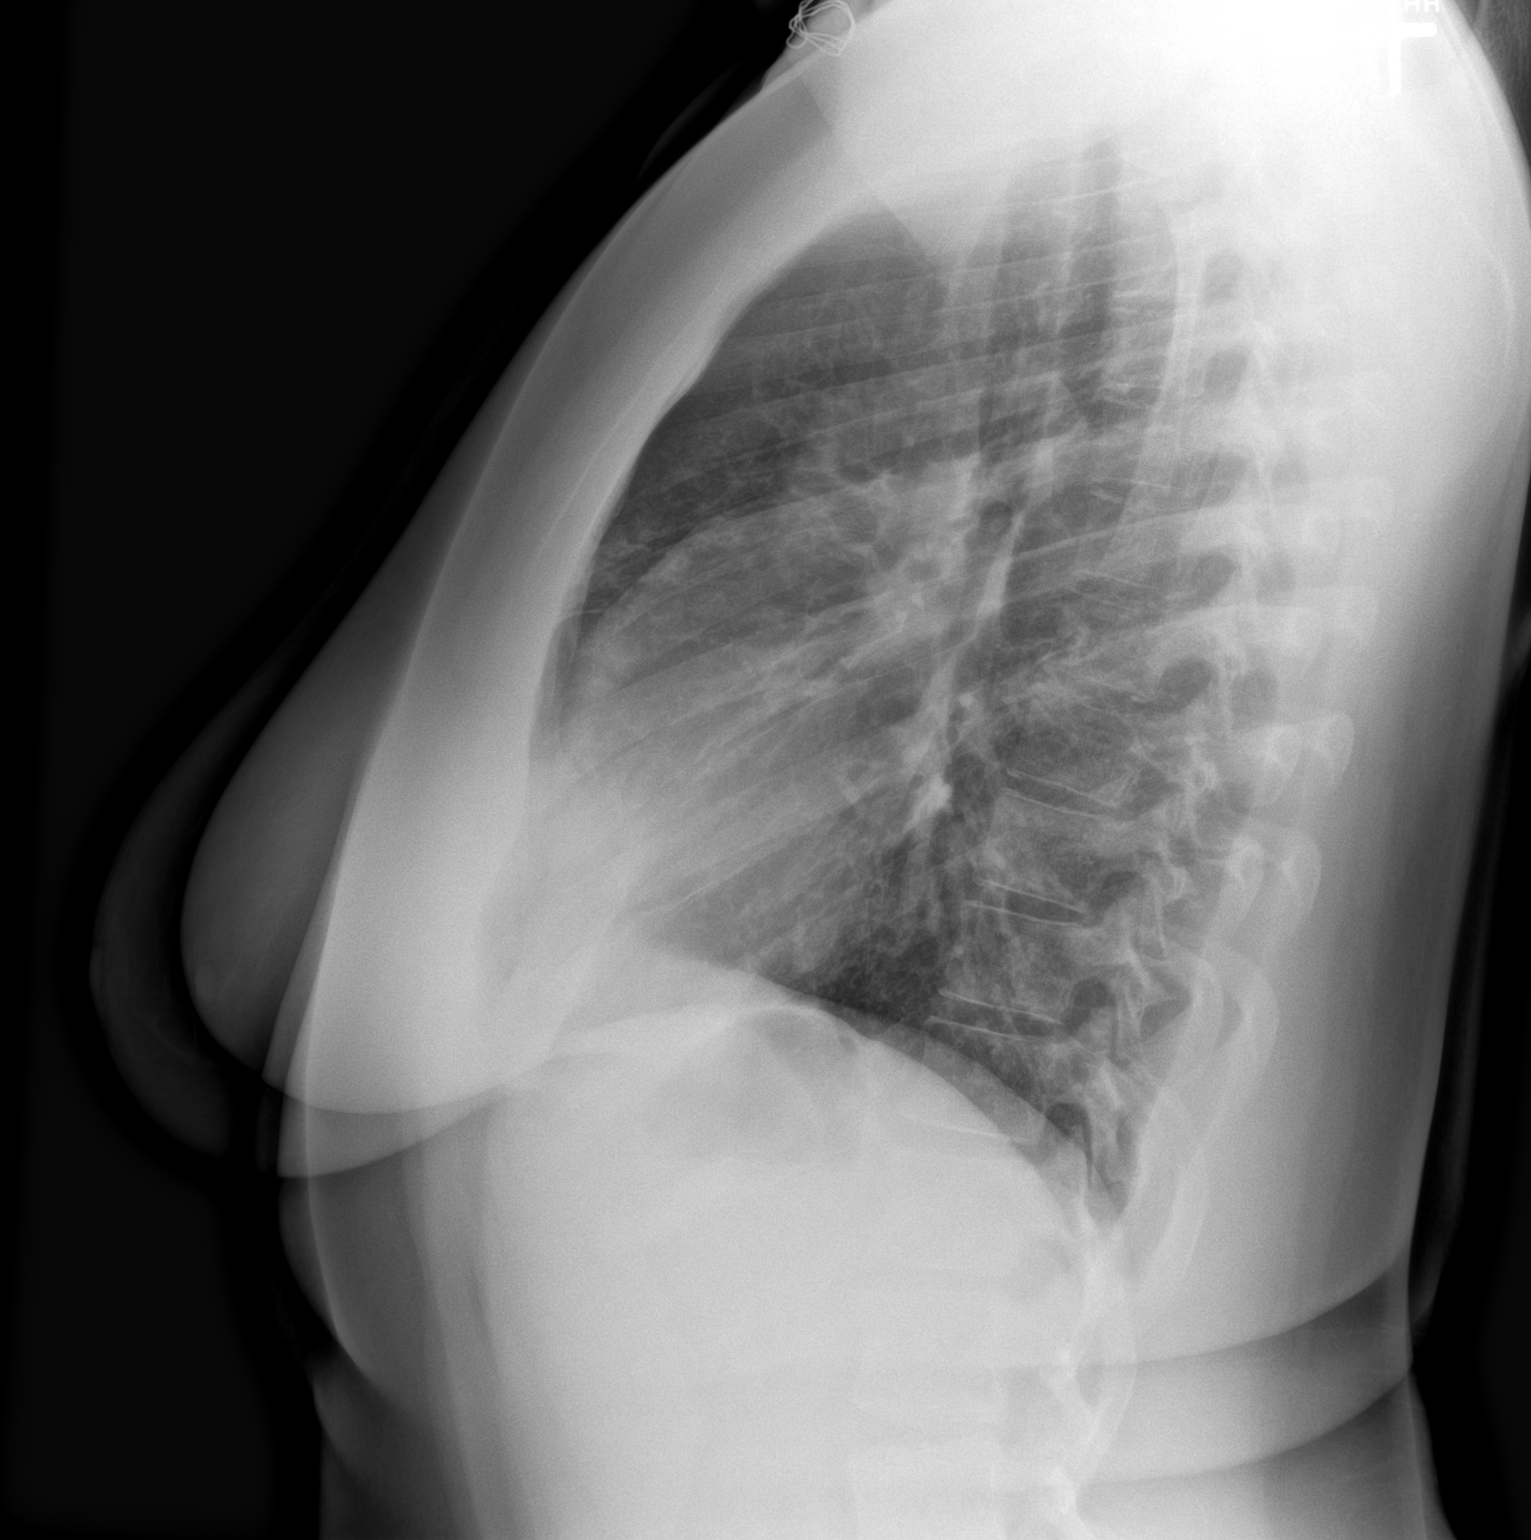

[2 of 2 positions shown; findings below may reference images not displayed]

FINDINGS: Heart size within normal limits. Ill-defined opacity within the left
lung base, which may reflect atelectasis, scarring or pneumonia.
Subtle linear focus of atelectasis or scarring within the right lung
base. No evidence of pleural effusion or pneumothorax. No acute bony
abnormality identified.
IMPRESSION: Ill-defined opacity within the left lung base, which may reflect
atelectasis, scarring or pneumonia.

Subtle linear focus of atelectasis versus scarring within the right
lung base.

## 2022-12-09 ENCOUNTER — Other Ambulatory Visit: Payer: Self-pay | Admitting: Physician Assistant

## 2022-12-09 ENCOUNTER — Telehealth: Payer: Self-pay | Admitting: Physician Assistant

## 2022-12-09 MED ORDER — ALBUTEROL SULFATE HFA 108 (90 BASE) MCG/ACT IN AERS: 1.0000 | INHALATION_SPRAY | RESPIRATORY_TRACT | 2 refills | Status: DC | PRN

## 2022-12-09 NOTE — Telephone Encounter (Signed)
Prescription Request  12/09/2022  LOV: 08/17/2022  What is the name of the medication or equipment? albuterol (VENTOLIN HFA) 108 (90 Base) MCG/ACT inhaler   Have you contacted your pharmacy to request a refill? Yes   Which pharmacy would you like this sent to?  [facility] on [address] [place], [place], [place] [address]    Patient notified that their request is being sent to the clinical staff for review and that they should receive a response within 2 business days.   Please advise at Mobile [phone] (mobile)

## 2022-12-09 NOTE — Telephone Encounter (Signed)
Left message on voicemail Rx for Albuterol inhaler sent to [facility] as requested.

## 2022-12-10 ENCOUNTER — Other Ambulatory Visit: Payer: Self-pay | Admitting: *Deleted

## 2022-12-10 MED ORDER — LEVALBUTEROL TARTRATE 45 MCG/ACT IN AERO: 1.0000 | INHALATION_SPRAY | Freq: Four times a day (QID) | RESPIRATORY_TRACT | 0 refills | Status: DC | PRN

## 2022-12-14 ENCOUNTER — Ambulatory Visit (INDEPENDENT_AMBULATORY_CARE_PROVIDER_SITE_OTHER): Payer: BLUE CROSS/BLUE SHIELD | Admitting: Physician Assistant

## 2022-12-14 ENCOUNTER — Other Ambulatory Visit (HOSPITAL_COMMUNITY)
Admission: RE | Admit: 2022-12-14 | Discharge: 2022-12-14 | Disposition: A | Discharge status: Home / Self Care | Payer: BLUE CROSS/BLUE SHIELD | Source: Ambulatory Visit | Attending: Physician Assistant | Admitting: Physician Assistant

## 2022-12-14 VITALS — BP 122/80 | HR 88 | Temp 98.2°F | Ht 70.25 in | Wt 291.0 lb

## 2022-12-14 DIAGNOSIS — Z113 Encounter for screening for infections with a predominantly sexual mode of transmission: Secondary | ICD-10-CM | POA: Insufficient documentation

## 2022-12-14 DIAGNOSIS — N926 Irregular menstruation, unspecified: Secondary | ICD-10-CM | POA: Insufficient documentation

## 2022-12-14 NOTE — Progress Notes (Signed)
Abigail Sandoval is a 22 y.o. female here for a new problem.  History of Present Illness:   Chief Complaint  Patient presents with   Amenorrhea    Pt has not had a period since 9/16-9/20. Pt has been having cramps, but no period.    HPI  Abnormal period She states that she her last period was in mid September. She states that her periods have always been regular, never had any complications. She was taking birth control at 75 to prevent pregnancy. She has been off of it for the past 5 years.  She is currently sexually active and not using any contraceptives, including condoms. She is experiencing abdominal cramps but no bleeding. She does report experiencing nausea. She denies any accompanying pain, breast tenderness, vaginal discharge, or foul smells.  Pregnancy test was negative 2 days ago.   She does report being stressed and denies any recent Covid infections.  She has never been to an [facility].    Past Medical History:  Diagnosis Date   Allergy    Anxiety    Asthma    Depression      Social History   Tobacco Use   Smoking status: Former    Types: E-cigarettes    Quit date: 08/09/2022    Years since quitting: 0.3   Smokeless tobacco: Never  Vaping Use   Vaping status: Every Day  Substance Use Topics   Alcohol use: Yes    Comment: socially twice a month   Drug use: Yes    Types: Marijuana    Past Surgical History:  Procedure Laterality Date   NO PAST SURGERIES      Family History  Problem Relation Age of Onset   Asthma Mother    Obesity Mother    Thyroid disease Mother    Asthma Sister    Asthma Maternal Grandmother    Breast cancer Maternal Grandmother    Diabetes Maternal Grandfather    Parkinson's disease Paternal Grandfather    Breast cancer Paternal Great-grandmother    Allergic rhinitis Neg Hx    Angioedema Neg Hx    Atopy Neg Hx    Eczema Neg Hx    Immunodeficiency Neg Hx    Urticaria Neg Hx     No Known Allergies  Current  Medications:   Current Outpatient Medications:    beclomethasone (QVAR) 40 MCG/ACT inhaler, Inhale 1 puff into the lungs 2 (two) times daily., Disp: 1 each, Rfl: 1   levalbuterol (XOPENEX HFA) 45 MCG/ACT inhaler, Inhale 1 puff into the lungs every 6 (six) hours as needed for wheezing., Disp: 1 each, Rfl: 0   Review of Systems:   Review of Systems  Gastrointestinal:  Negative for abdominal pain.       +cramps    Vitals:   Vitals:   12/14/22 1441  BP: 122/80  Pulse: 88  Temp: 98.2 F (36.8 C)  TempSrc: Temporal  Weight: 291 lb (132 kg)  Height: 5' 10.25" (1.784 m)     Body mass index is 41.46 kg/m.  Physical Exam:   Physical Exam Vitals and nursing note reviewed.  Constitutional:      General: She is not in acute distress.    Appearance: She is well-developed. She is not ill-appearing or toxic-appearing.  Cardiovascular:     Rate and Rhythm: Normal rate and regular rhythm.     Pulses: Normal pulses.     Heart sounds: Normal heart sounds, S1 normal and S2 normal.  Pulmonary:  Effort: Pulmonary effort is normal.     Breath sounds: Normal breath sounds.  Skin:    General: Skin is warm and dry.  Neurological:     Mental Status: She is alert.     GCS: GCS eye subscore is 4. GCS verbal subscore is 5. GCS motor subscore is 6.  Psychiatric:        Speech: Speech normal.        Behavior: Behavior normal. Behavior is cooperative.    Self swab collected  Assessment and Plan:   Irregular periods Update blood work to assess for organic cause, however did discuss that one abnormal period may not be of significant concern Referral to gynecology to begin pap smears and discuss further guidance on abnormal periods, if persists Encouraged condoms if not trying to conceive and to prevent sexual transmitted infection    Jarold Motto, PA-C  I,Safa M Kadhim,acting as a scribe for [employer], PA.,have documented all relevant documentation on the behalf of Jarold Motto, PA,as directed by  Jarold Motto, PA while in the presence of Jarold Motto, [place].   I, Jarold Motto, [place], have reviewed all documentation for this visit. The documentation on 12/14/22 for the exam, diagnosis, procedures, and orders are all accurate and complete.

## 2022-12-15 LAB — CBC WITH DIFFERENTIAL/PLATELET
Basophils Absolute: 0.1 10*3/uL (ref 0.0–0.1)
Basophils Relative: 1.4 % (ref 0.0–3.0)
Eosinophils Absolute: 0.1 10*3/uL (ref 0.0–0.7)
Eosinophils Relative: 2 % (ref 0.0–5.0)
HCT: 39.8 % (ref 36.0–46.0)
Hemoglobin: 12.7 g/dL (ref 12.0–15.0)
Lymphocytes Relative: 30.9 % (ref 12.0–46.0)
Lymphs Abs: 1.9 10*3/uL (ref 0.7–4.0)
MCHC: 31.8 g/dL (ref 30.0–36.0)
MCV: 86.9 fL (ref 78.0–100.0)
Monocytes Absolute: 0.5 10*3/uL (ref 0.1–1.0)
Monocytes Relative: 7.9 % (ref 3.0–12.0)
Neutro Abs: 3.6 10*3/uL (ref 1.4–7.7)
Neutrophils Relative %: 57.8 % (ref 43.0–77.0)
Platelets: 301 10*3/uL (ref 150.0–400.0)
RBC: 4.59 Mil/uL (ref 3.87–5.11)
RDW: 13.5 % (ref 11.5–15.5)
WBC: 6.3 10*3/uL (ref 4.0–10.5)

## 2022-12-15 LAB — COMPREHENSIVE METABOLIC PANEL
ALT: 18 U/L (ref 0–35)
AST: 13 U/L (ref 0–37)
Albumin: 4 g/dL (ref 3.5–5.2)
Alkaline Phosphatase: 52 U/L (ref 39–117)
BUN: 11 mg/dL (ref 6–23)
CO2: 27 meq/L (ref 19–32)
Calcium: 9.1 mg/dL (ref 8.4–10.5)
Chloride: 105 meq/L (ref 96–112)
Creatinine, Ser: 0.87 mg/dL (ref 0.40–1.20)
GFR: 95.45 mL/min (ref >60.00)
Glucose, Bld: 92 mg/dL (ref 70–99)
Potassium: 4.2 meq/L (ref 3.5–5.1)
Sodium: 137 meq/L (ref 135–145)
Total Bilirubin: 0.5 mg/dL (ref 0.2–1.2)
Total Protein: 7 g/dL (ref 6.0–8.3)

## 2022-12-15 LAB — PROLACTIN: Prolactin: 15.3 ng/mL

## 2022-12-15 LAB — TESTOSTERONE: Testosterone: 37.56 ng/dL (ref 15.00–40.00)

## 2022-12-15 LAB — IBC + FERRITIN
Ferritin: 23.5 ng/mL (ref 10.0–291.0)
Iron: 126 ug/dL (ref 42–145)
Saturation Ratios: 34.1 % (ref 20.0–50.0)
TIBC: 369.6 ug/dL (ref 250.0–450.0)
Transferrin: 264 mg/dL (ref 212.0–360.0)

## 2022-12-15 LAB — HCG, QUANTITATIVE, PREGNANCY: Quantitative HCG: <0.6 m[IU]/mL

## 2022-12-16 LAB — CERVICOVAGINAL ANCILLARY ONLY
Bacterial Vaginitis (gardnerella): NEGATIVE
Candida Glabrata: NEGATIVE
Candida Vaginitis: NEGATIVE
Chlamydia: NEGATIVE
Comment: NEGATIVE
Comment: NEGATIVE
Comment: NEGATIVE
Comment: NEGATIVE
Comment: NEGATIVE
Comment: NORMAL
Neisseria Gonorrhea: NEGATIVE
Trichomonas: NEGATIVE

## 2023-02-18 ENCOUNTER — Ambulatory Visit: Payer: BLUE CROSS/BLUE SHIELD | Admitting: Obstetrics and Gynecology

## 2023-02-18 NOTE — Progress Notes (Deleted)
    23 y.o. No obstetric history on file. female here for ***  No LMP recorded.    Birth control: *** Last mammogram: *** Sexually active: ***    GYN HISTORY: ***  OB History  No obstetric history on file.    Past Medical History:  Diagnosis Date   Allergy    Anxiety    Asthma    Depression     Past Surgical History:  Procedure Laterality Date   NO PAST SURGERIES      Current Outpatient Medications on File Prior to Visit  Medication Sig Dispense Refill   beclomethasone (QVAR) 40 MCG/ACT inhaler Inhale 1 puff into the lungs 2 (two) times daily. 1 each 1   levalbuterol  (XOPENEX  HFA) 45 MCG/ACT inhaler Inhale 1 puff into the lungs every 6 (six) hours as needed for wheezing. 1 each 0   No current facility-administered medications on file prior to visit.    No Known Allergies    PE There were no vitals filed for this visit. There is no height or weight on file to calculate BMI.  Physical Exam    Assessment and Plan:        There are no diagnoses linked to this encounter.   Vera LULLA Pa, MD

## 2023-05-20 ENCOUNTER — Ambulatory Visit: Admitting: Physician Assistant

## 2023-05-24 ENCOUNTER — Ambulatory Visit: Admitting: Physician Assistant

## 2023-12-09 ENCOUNTER — Ambulatory Visit: Admitting: Physician Assistant

## 2023-12-09 ENCOUNTER — Other Ambulatory Visit (HOSPITAL_COMMUNITY)
Admission: RE | Admit: 2023-12-09 | Discharge: 2023-12-09 | Disposition: A | Source: Ambulatory Visit | Attending: Physician Assistant | Admitting: Physician Assistant

## 2023-12-09 ENCOUNTER — Encounter: Payer: Self-pay | Admitting: Physician Assistant

## 2023-12-09 VITALS — BP 130/86 | HR 88 | Temp 97.3°F | Ht 70.25 in | Wt 295.0 lb

## 2023-12-09 DIAGNOSIS — J4520 Mild intermittent asthma, uncomplicated: Secondary | ICD-10-CM

## 2023-12-09 DIAGNOSIS — Z124 Encounter for screening for malignant neoplasm of cervix: Secondary | ICD-10-CM | POA: Diagnosis not present

## 2023-12-09 DIAGNOSIS — E669 Obesity, unspecified: Secondary | ICD-10-CM

## 2023-12-09 DIAGNOSIS — Z113 Encounter for screening for infections with a predominantly sexual mode of transmission: Secondary | ICD-10-CM

## 2023-12-09 MED ORDER — ALBUTEROL SULFATE HFA 108 (90 BASE) MCG/ACT IN AERS: 1.0000 | INHALATION_SPRAY | RESPIRATORY_TRACT | 1 refills | Status: AC | PRN

## 2023-12-09 NOTE — Patient Instructions (Signed)
 It was great to see you!  Albuterol  refill sent  We will update sexual transmitted infection testing  Pap smear completed  Referral to dietitian  Let's follow-up in 3 months, sooner if you have concerns.  Take care,  Lucie Buttner PA-C

## 2023-12-09 NOTE — Progress Notes (Signed)
 Abigail Sandoval is a 23 y.o. female here for a follow up of a pre-existing problem.  History of Present Illness:   Chief Complaint  Patient presents with   Weight Management Screening    Pt would like to discuss weight loss medications.   STD testing     Pt would like STD panel done.   Medication Refill    Pt needs albuterol  inhaler refilled.    Discussed the use of AI scribe software for clinical note transcription with the patient, who gave verbal consent to proceed.  History of Present Illness   Abigail Sandoval is a 23 year old female who presents with concerns about breathing difficulties, weight management, and STD testing.  She experiences breathing difficulties, particularly in cold weather, and uses an over-the-counter inhaler, which causes a burning sensation in her lungs.  She is concerned about her weight, noting significant weight gain. She previously attempted weight loss injections, which were not covered by insurance. Her mother suggested Ozempic , but she feels too young for it. She is not diabetic and recalls previous A1c tests. Recently, she weighed 300 pounds and has lost five pounds by reducing carbohydrate intake, eating healthier meals, and increasing physical activity. She has eliminated alcohol and sugary drinks, consuming only water.  She requests STD testing as a precautionary measure without confirmed exposure. She recently completed a three-step over-the-counter treatment for a yeast infection with symptom improvement. She inquires about the normal anatomy of the vulva and has never had a Pap smear.        Past Medical History:  Diagnosis Date   Allergy    Anxiety    Asthma    Depression      Social History   Tobacco Use   Smoking status: Former    Types: E-cigarettes    Quit date: 08/09/2022    Years since quitting: 1.3   Smokeless tobacco: Never  Vaping Use   Vaping status: Every Day  Substance Use Topics   Alcohol use: Yes     Comment: socially twice a month   Drug use: Yes    Types: Marijuana    Past Surgical History:  Procedure Laterality Date   NO PAST SURGERIES      Family History  Problem Relation Age of Onset   Asthma Mother    Obesity Mother    Thyroid disease Mother    Asthma Sister    Asthma Maternal Grandmother    Breast cancer Maternal Grandmother    Diabetes Maternal Grandfather    Parkinson's disease Paternal Grandfather    Breast cancer Paternal Great-grandmother    Allergic rhinitis Neg Hx    Angioedema Neg Hx    Atopy Neg Hx    Eczema Neg Hx    Immunodeficiency Neg Hx    Urticaria Neg Hx     No Known Allergies  Current Medications:   Current Outpatient Medications:    Ferrous Sulfate (IRON) 325 (65 Fe) MG TABS, Take 1 tablet by mouth as needed., Disp: , Rfl:    MAGNESIUM PO, Take 1 capsule by mouth as needed., Disp: , Rfl:    Multiple Vitamin (MULTIVITAMIN) tablet, Take 1 tablet by mouth daily., Disp: , Rfl:    albuterol  (VENTOLIN  HFA) 108 (90 Base) MCG/ACT inhaler, Inhale 1-2 puffs into the lungs every 4 (four) hours as needed., Disp: 18 g, Rfl: 1   Review of Systems:   Negative unless otherwise specified per HPI.  Vitals:   Vitals:   12/09/23 1120  BP: 130/86  Pulse: 88  Temp: (!) 97.3 F (36.3 C)  TempSrc: Temporal  SpO2: 99%  Weight: 295 lb (133.8 kg)  Height: 5' 10.25 (1.784 m)     Body mass index is 42.03 kg/m.  Physical Exam:   Physical Exam Vitals and nursing note reviewed. Exam conducted with a chaperone present.  Constitutional:      General: She is not in acute distress.    Appearance: She is well-developed. She is not ill-appearing or toxic-appearing.  Cardiovascular:     Rate and Rhythm: Normal rate and regular rhythm.     Pulses: Normal pulses.     Heart sounds: Normal heart sounds, S1 normal and S2 normal.  Pulmonary:     Effort: Pulmonary effort is normal.     Breath sounds: Normal breath sounds.  Genitourinary:    Labia:         Right: No rash or tenderness.        Left: No rash or tenderness.      Vagina: Normal.     Cervix: Normal.  Skin:    General: Skin is warm and dry.  Neurological:     Mental Status: She is alert.     GCS: GCS eye subscore is 4. GCS verbal subscore is 5. GCS motor subscore is 6.  Psychiatric:        Speech: Speech normal.        Behavior: Behavior normal. Behavior is cooperative.     Assessment and Plan:   Assessment and Plan    Screening for sexually transmitted infections Discussed testing options: self-administered vaginal swab or urine test for gonorrhea, chlamydia, and trichomoniasis, and blood work for HIV and syphilis. - Perform vaginal swab for gonorrhea, chlamydia, and trichomoniasis. - recommend safe sex or abstinence until results return - Order blood work for HIV and syphilis.  Mild intermittent asthma Current over-the-counter inhaler causes lung discomfort. Switching to albuterol  inhaler for better tolerance and symptom control. - Prescribe albuterol  inhaler.  Obesity Recent weight gain to 300 pounds. Interested in non-injection weight loss options and dietary changes. Implementing lifestyle modifications: reduced carbohydrate intake and increased physical activity. Discussed potential future availability of oral weight loss medications. - Refer to dietitian for nutritional counseling. - Monitor for availability of new oral weight loss medications.     Follow up in 3 months for Comprehensive Physical Exam (CPE) preventive care annual visit -- sooner if concerns    Lucie Buttner, PA-C

## 2023-12-10 LAB — CERVICOVAGINAL ANCILLARY ONLY
Bacterial Vaginitis (gardnerella): POSITIVE — AB
Candida Glabrata: NEGATIVE
Candida Vaginitis: NEGATIVE
Chlamydia: NEGATIVE
Comment: NEGATIVE
Comment: NEGATIVE
Comment: NEGATIVE
Comment: NEGATIVE
Comment: NEGATIVE
Comment: NORMAL
Neisseria Gonorrhea: NEGATIVE
Trichomonas: POSITIVE — AB

## 2023-12-10 LAB — RPR: RPR Ser Ql: NONREACTIVE

## 2023-12-10 LAB — HIV ANTIBODY (ROUTINE TESTING W REFLEX)
HIV 1&2 Ab, 4th Generation: NONREACTIVE
HIV FINAL INTERPRETATION: NEGATIVE

## 2023-12-12 ENCOUNTER — Ambulatory Visit: Payer: Self-pay | Admitting: Physician Assistant

## 2023-12-12 MED ORDER — METRONIDAZOLE 500 MG PO TABS: 500.0000 mg | ORAL_TABLET | Freq: Two times a day (BID) | ORAL | 0 refills | Status: AC

## 2023-12-14 LAB — CYTOLOGY - PAP
Adequacy: ABSENT
Comment: NEGATIVE
Diagnosis: UNDETERMINED — AB
High risk HPV: NEGATIVE

## 2024-02-22 ENCOUNTER — Ambulatory Visit
Admission: EM | Admit: 2024-02-22 | Discharge: 2024-02-22 | Disposition: A | Discharge status: Home / Self Care | Attending: Family Medicine | Admitting: Family Medicine

## 2024-02-22 DIAGNOSIS — L03213 Periorbital cellulitis: Secondary | ICD-10-CM | POA: Diagnosis not present

## 2024-02-22 MED ORDER — AMOXICILLIN-POT CLAVULANATE 875-125 MG PO TABS: 1.0000 | ORAL_TABLET | Freq: Two times a day (BID) | ORAL | 0 refills | Status: AC

## 2024-02-22 MED ORDER — TOBRAMYCIN 0.3 % OP SOLN: 1.0000 [drp] | OPHTHALMIC | 0 refills | Status: AC

## 2024-02-22 NOTE — ED Triage Notes (Signed)
 Pt states that she has some bilateral eye pain. Pt states that she has some left eye drainage.  X1 day

## 2024-02-22 NOTE — ED Provider Notes (Signed)
 " [facility]  Note:  This document was prepared using Conservation officer, historic buildings and may include unintentional dictation errors.  MRN: [id number] DOB: 02/25/2000  Subjective:   Abigail Sandoval is a 24 y.o. female presenting for bilateral eye irritation, left eye redness and drainage for the past day.  Has felt pain around her left eye as well.  No fever, eye trauma, vision change, new exposures, itching, rash.  Current Outpatient Medications  Medication Instructions   albuterol  (VENTOLIN  HFA) 108 (90 Base) MCG/ACT inhaler 1-2 puffs, Inhalation, Every 4 hours PRN   Ferrous Sulfate (IRON) 325 (65 Fe) MG TABS 1 tablet, As needed   MAGNESIUM PO 1 capsule, As needed   Multiple Vitamin (MULTIVITAMIN) tablet 1 tablet, Daily    Allergies[1]  Past Medical History:  Diagnosis Date   Allergy    Anxiety    Asthma    Depression      Past Surgical History:  Procedure Laterality Date   NO PAST SURGERIES      Family History  Problem Relation Age of Onset   Asthma Mother    Obesity Mother    Thyroid disease Mother    Asthma Sister    Asthma Maternal Grandmother    Breast cancer Maternal Grandmother    Diabetes Maternal Grandfather    Parkinson's disease Paternal Grandfather    Breast cancer Paternal Great-grandmother    Allergic rhinitis Neg Hx    Angioedema Neg Hx    Atopy Neg Hx    Eczema Neg Hx    Immunodeficiency Neg Hx    Urticaria Neg Hx     Social History   Occupational History   Not on file  Tobacco Use   Smoking status: Former    Types: E-cigarettes    Quit date: 08/09/2022    Years since quitting: 1.5   Smokeless tobacco: Never  Vaping Use   Vaping status: Every Day  Substance and Sexual Activity   Alcohol use: Not Currently    Comment: socially twice a month   Drug use: Yes    Types: Marijuana   Sexual activity: Yes    Birth control/protection: None     ROS   Objective:   Vitals: BP 121/81 (BP Location: Left  Arm)   Pulse 86   Temp 98.4 F (36.9 C) (Oral)   Resp 19   Ht 5' 10 (1.778 m)   Wt 280 lb (127 kg)   LMP 02/05/2024   SpO2 96%   BMI 40.18 kg/m   Physical Exam Constitutional:      General: She is not in acute distress.    Appearance: Normal appearance. She is well-developed. She is not ill-appearing, toxic-appearing or diaphoretic.  HENT:     Head: Normocephalic and atraumatic.     Nose: Nose normal.     Mouth/Throat:     Mouth: Mucous membranes are moist.  Eyes:     General: Lids are everted, no foreign bodies appreciated. Vision grossly intact. No scleral icterus.       Right eye: No foreign body, discharge or hordeolum.        Left eye: No foreign body, discharge or hordeolum.     Extraocular Movements: Extraocular movements intact.     Right eye: Normal extraocular motion.     Left eye: Normal extraocular motion and no nystagmus.     Conjunctiva/sclera:     Right eye: Right conjunctiva is not injected. No chemosis, exudate or hemorrhage.  Left eye: Left conjunctiva is injected. No chemosis, exudate or hemorrhage.    Comments: 1+ swelling of the left upper eyelid, trace swelling of the lower left eyelid with hyperpigmentation and tenderness.  Cardiovascular:     Rate and Rhythm: Normal rate.  Pulmonary:     Effort: Pulmonary effort is normal.  Skin:    General: Skin is warm and dry.  Neurological:     General: No focal deficit present.     Mental Status: She is alert and oriented to person, place, and time.  Psychiatric:        Mood and Affect: Mood normal.        Behavior: Behavior normal.     Assessment and Plan :   PDMP not reviewed this encounter.  1. Preseptal cellulitis of left upper eyelid   2. Preseptal cellulitis of left lower eyelid      Will start management of preseptal cellulitis of the left eyelids with Augmentin , tobramycin  for the left eye.  Counseled patient on potential for adverse effects with medications prescribed/recommended today,  ER and return-to-clinic precautions discussed, patient verbalized understanding.     [1] No Known Allergies    Christopher Savannah, [place] 02/25/24 [id number]  "
